# Patient Record
Sex: Female | Born: 1970
Health system: Southern US, Community
[De-identification: ages and names within clinical notes are randomized; demographics above are authoritative.]

## PROBLEM LIST (undated history)

## (undated) DIAGNOSIS — F419 Anxiety disorder, unspecified: Secondary | ICD-10-CM

## (undated) DIAGNOSIS — I10 Essential (primary) hypertension: Secondary | ICD-10-CM

## (undated) HISTORY — PX: WISDOM TOOTH EXTRACTION: SHX21

---

## 2004-10-27 ENCOUNTER — Ambulatory Visit (HOSPITAL_COMMUNITY): Admission: RE | Admit: 2004-10-27 | Discharge: 2004-10-27 | Payer: Self-pay | Admitting: Obstetrics & Gynecology

## 2005-02-15 ENCOUNTER — Other Ambulatory Visit: Admission: RE | Admit: 2005-02-15 | Discharge: 2005-02-15 | Payer: Self-pay | Admitting: Obstetrics and Gynecology

## 2005-07-21 ENCOUNTER — Inpatient Hospital Stay (HOSPITAL_COMMUNITY): Admission: AD | Admit: 2005-07-21 | Discharge: 2005-07-21 | Payer: Self-pay | Admitting: Obstetrics and Gynecology

## 2005-08-13 ENCOUNTER — Inpatient Hospital Stay (HOSPITAL_COMMUNITY): Admission: AD | Admit: 2005-08-13 | Discharge: 2005-08-15 | Payer: Self-pay | Admitting: Obstetrics & Gynecology

## 2005-08-22 ENCOUNTER — Ambulatory Visit: Admission: RE | Admit: 2005-08-22 | Discharge: 2005-08-22 | Payer: Self-pay | Admitting: Obstetrics and Gynecology

## 2005-09-26 ENCOUNTER — Other Ambulatory Visit: Admission: RE | Admit: 2005-09-26 | Discharge: 2005-09-26 | Payer: Self-pay | Admitting: Obstetrics and Gynecology

## 2005-10-31 ENCOUNTER — Encounter: Admission: RE | Admit: 2005-10-31 | Discharge: 2005-10-31 | Payer: Self-pay | Admitting: Obstetrics and Gynecology

## 2006-08-07 ENCOUNTER — Inpatient Hospital Stay (HOSPITAL_COMMUNITY): Admission: AD | Admit: 2006-08-07 | Discharge: 2006-08-07 | Payer: Self-pay | Admitting: Obstetrics and Gynecology

## 2006-10-02 ENCOUNTER — Inpatient Hospital Stay (HOSPITAL_COMMUNITY): Admission: AD | Admit: 2006-10-02 | Discharge: 2006-10-03 | Payer: Self-pay | Admitting: Obstetrics and Gynecology

## 2012-02-27 LAB — OB RESULTS CONSOLE GC/CHLAMYDIA: Chlamydia: NEGATIVE

## 2012-03-12 LAB — OB RESULTS CONSOLE RPR: RPR: NONREACTIVE

## 2012-03-12 LAB — OB RESULTS CONSOLE ANTIBODY SCREEN: Antibody Screen: NEGATIVE

## 2012-03-12 LAB — OB RESULTS CONSOLE GC/CHLAMYDIA: Gonorrhea: NEGATIVE

## 2012-03-12 LAB — OB RESULTS CONSOLE GBS: GBS: NEGATIVE

## 2012-09-13 ENCOUNTER — Inpatient Hospital Stay (HOSPITAL_COMMUNITY)
Admission: AD | Admit: 2012-09-13 | Discharge: 2012-09-15 | DRG: 775 | Disposition: A | Discharge status: Home / Self Care | Payer: No Typology Code available for payment source | Source: Ambulatory Visit | Attending: Obstetrics and Gynecology | Admitting: Obstetrics and Gynecology

## 2012-09-13 ENCOUNTER — Encounter (HOSPITAL_COMMUNITY): Payer: Self-pay | Admitting: Anesthesiology

## 2012-09-13 ENCOUNTER — Observation Stay (HOSPITAL_COMMUNITY): Payer: No Typology Code available for payment source | Admitting: Anesthesiology

## 2012-09-13 ENCOUNTER — Encounter (HOSPITAL_COMMUNITY): Payer: Self-pay

## 2012-09-13 DIAGNOSIS — O09529 Supervision of elderly multigravida, unspecified trimester: Secondary | ICD-10-CM | POA: Diagnosis present

## 2012-09-13 HISTORY — DX: Anxiety disorder, unspecified: F41.9

## 2012-09-13 LAB — COMPREHENSIVE METABOLIC PANEL
ALT: 8 U/L (ref 0–35)
AST: 15 U/L (ref 0–37)
Albumin: 2.8 g/dL — ABNORMAL LOW (ref 3.5–5.2)
Calcium: 9.5 mg/dL (ref 8.4–10.5)
Creatinine, Ser: 0.63 mg/dL (ref 0.50–1.10)
Sodium: 136 mEq/L (ref 135–145)
Total Protein: 6.9 g/dL (ref 6.0–8.3)

## 2012-09-13 LAB — TYPE AND SCREEN
ABO/RH(D): B POS
Antibody Screen: NEGATIVE

## 2012-09-13 LAB — CBC
MCH: 31.7 pg (ref 26.0–34.0)
MCV: 90.5 fL (ref 78.0–100.0)
Platelets: 240 10*3/uL (ref 150–400)
RDW: 13.2 % (ref 11.5–15.5)

## 2012-09-13 LAB — ABO/RH: ABO/RH(D): B POS

## 2012-09-13 MED ORDER — TETANUS-DIPHTH-ACELL PERTUSSIS 5-2.5-18.5 LF-MCG/0.5 IM SUSP: 0.5000 mL | Freq: Once | INTRAMUSCULAR | Status: DC

## 2012-09-13 MED ORDER — DIPHENHYDRAMINE HCL 25 MG PO CAPS: 25.0000 mg | ORAL_CAPSULE | Freq: Four times a day (QID) | ORAL | Status: DC | PRN

## 2012-09-13 MED ORDER — OXYCODONE-ACETAMINOPHEN 5-325 MG PO TABS
1.0000 | ORAL_TABLET | ORAL | Status: DC | PRN
  Administered 2012-09-14 – 2012-09-15 (×2): 1 via ORAL
  Filled 2012-09-13 (×2): qty 1

## 2012-09-13 MED ORDER — PRENATAL MULTIVITAMIN CH
1.0000 | ORAL_TABLET | Freq: Every day | ORAL | Status: DC
  Administered 2012-09-14 – 2012-09-15 (×2): 1 via ORAL
  Filled 2012-09-13 (×2): qty 1

## 2012-09-13 MED ORDER — ZOLPIDEM TARTRATE 5 MG PO TABS: 5.0000 mg | ORAL_TABLET | Freq: Every evening | ORAL | Status: DC | PRN

## 2012-09-13 MED ORDER — ONDANSETRON HCL 4 MG/2ML IJ SOLN: 4.0000 mg | INTRAMUSCULAR | Status: DC | PRN

## 2012-09-13 MED ORDER — PHENYLEPHRINE 40 MCG/ML (10ML) SYRINGE FOR IV PUSH (FOR BLOOD PRESSURE SUPPORT)
80.0000 ug | PREFILLED_SYRINGE | INTRAVENOUS | Status: DC | PRN
  Filled 2012-09-13: qty 5

## 2012-09-13 MED ORDER — ONDANSETRON HCL 4 MG PO TABS: 4.0000 mg | ORAL_TABLET | ORAL | Status: DC | PRN

## 2012-09-13 MED ORDER — LACTATED RINGERS IV SOLN: 500.0000 mL | Freq: Once | INTRAVENOUS | Status: DC

## 2012-09-13 MED ORDER — LACTATED RINGERS IV SOLN
500.0000 mL | INTRAVENOUS | Status: DC | PRN
  Administered 2012-09-13: 500 mL via INTRAVENOUS
  Administered 2012-09-13: 1000 mL via INTRAVENOUS

## 2012-09-13 MED ORDER — ACETAMINOPHEN 325 MG PO TABS: 650.0000 mg | ORAL_TABLET | ORAL | Status: DC | PRN

## 2012-09-13 MED ORDER — SIMETHICONE 80 MG PO CHEW: 80.0000 mg | CHEWABLE_TABLET | ORAL | Status: DC | PRN

## 2012-09-13 MED ORDER — BENZOCAINE-MENTHOL 20-0.5 % EX AERO
1.0000 "application " | INHALATION_SPRAY | CUTANEOUS | Status: DC | PRN
  Administered 2012-09-14: 1 via TOPICAL
  Filled 2012-09-13 (×2): qty 56

## 2012-09-13 MED ORDER — IBUPROFEN 600 MG PO TABS: 600.0000 mg | ORAL_TABLET | Freq: Four times a day (QID) | ORAL | Status: DC | PRN

## 2012-09-13 MED ORDER — WITCH HAZEL-GLYCERIN EX PADS: 1.0000 "application " | MEDICATED_PAD | CUTANEOUS | Status: DC | PRN

## 2012-09-13 MED ORDER — FENTANYL 2.5 MCG/ML BUPIVACAINE 1/10 % EPIDURAL INFUSION (WH - ANES)
14.0000 mL/h | INTRAMUSCULAR | Status: DC
  Administered 2012-09-13: 14 mL/h via EPIDURAL
  Filled 2012-09-13: qty 125

## 2012-09-13 MED ORDER — DIBUCAINE 1 % RE OINT: 1.0000 "application " | TOPICAL_OINTMENT | RECTAL | Status: DC | PRN

## 2012-09-13 MED ORDER — BISACODYL 10 MG RE SUPP: 10.0000 mg | Freq: Every day | RECTAL | Status: DC | PRN

## 2012-09-13 MED ORDER — LIDOCAINE HCL (PF) 1 % IJ SOLN
30.0000 mL | INTRAMUSCULAR | Status: DC | PRN
  Filled 2012-09-13: qty 30

## 2012-09-13 MED ORDER — LIDOCAINE HCL (PF) 1 % IJ SOLN
INTRAMUSCULAR | Status: DC | PRN
  Administered 2012-09-13 (×2): 5 mL

## 2012-09-13 MED ORDER — OXYTOCIN 40 UNITS IN LACTATED RINGERS INFUSION - SIMPLE MED
62.5000 mL/h | INTRAVENOUS | Status: DC
  Administered 2012-09-13: 62.5 mL/h via INTRAVENOUS
  Filled 2012-09-13: qty 1000

## 2012-09-13 MED ORDER — FLEET ENEMA 7-19 GM/118ML RE ENEM: 1.0000 | ENEMA | RECTAL | Status: DC | PRN

## 2012-09-13 MED ORDER — IBUPROFEN 600 MG PO TABS
600.0000 mg | ORAL_TABLET | Freq: Four times a day (QID) | ORAL | Status: DC
  Administered 2012-09-14 – 2012-09-15 (×6): 600 mg via ORAL
  Filled 2012-09-13 (×6): qty 1

## 2012-09-13 MED ORDER — EPHEDRINE 5 MG/ML INJ: 10.0000 mg | INTRAVENOUS | Status: DC | PRN

## 2012-09-13 MED ORDER — CITRIC ACID-SODIUM CITRATE 334-500 MG/5ML PO SOLN: 30.0000 mL | ORAL | Status: DC | PRN

## 2012-09-13 MED ORDER — DIPHENHYDRAMINE HCL 50 MG/ML IJ SOLN: 12.5000 mg | INTRAMUSCULAR | Status: DC | PRN

## 2012-09-13 MED ORDER — OXYCODONE-ACETAMINOPHEN 5-325 MG PO TABS: 1.0000 | ORAL_TABLET | ORAL | Status: DC | PRN

## 2012-09-13 MED ORDER — LANOLIN HYDROUS EX OINT: TOPICAL_OINTMENT | CUTANEOUS | Status: DC | PRN

## 2012-09-13 MED ORDER — EPHEDRINE 5 MG/ML INJ
10.0000 mg | INTRAVENOUS | Status: DC | PRN
  Filled 2012-09-13: qty 4

## 2012-09-13 MED ORDER — ONDANSETRON HCL 4 MG/2ML IJ SOLN: 4.0000 mg | Freq: Four times a day (QID) | INTRAMUSCULAR | Status: DC | PRN

## 2012-09-13 MED ORDER — SENNOSIDES-DOCUSATE SODIUM 8.6-50 MG PO TABS
2.0000 | ORAL_TABLET | Freq: Every day | ORAL | Status: DC
  Administered 2012-09-14: 2 via ORAL

## 2012-09-13 MED ORDER — PHENYLEPHRINE 40 MCG/ML (10ML) SYRINGE FOR IV PUSH (FOR BLOOD PRESSURE SUPPORT): 80.0000 ug | PREFILLED_SYRINGE | INTRAVENOUS | Status: DC | PRN

## 2012-09-13 MED ORDER — OXYTOCIN BOLUS FROM INFUSION: 500.0000 mL | INTRAVENOUS | Status: DC

## 2012-09-13 MED ORDER — FLEET ENEMA 7-19 GM/118ML RE ENEM: 1.0000 | ENEMA | Freq: Every day | RECTAL | Status: DC | PRN

## 2012-09-13 MED ORDER — LACTATED RINGERS IV SOLN
INTRAVENOUS | Status: DC
  Administered 2012-09-13: 1000 mL via INTRAVENOUS

## 2012-09-13 NOTE — Progress Notes (Signed)
Delivery Note At 8:29 PM a viable female was delivered via Vaginal, Spontaneous Delivery (Presentation: ROA  ).  APGAR:8 ,9 ; weight pending .   Placenta status:intact , . 3 vessel  Cord:  with the following complications:none .  Cord pH: pending  Anesthesia: Epidural  Episiotomy:  Lacerations: small second degree ML lac repaired Suture Repair: vicryl rapide Est. Blood Loss (mL):   Mom to postpartum.  Baby to nursery-stable.  Xaniyah Buchholz II,Izsak Meir E 09/13/2012, 8:41 PM

## 2012-09-13 NOTE — MAU Note (Signed)
Pt states was 3cm/50% effaced Tuesday in office. Ctx's q6.5 minutes apart. Denies bleeding. Lost mucus plug

## 2012-09-13 NOTE — H&P (Signed)
Sherry Hardy is a 41 y.o. female presenting for C/O contractions. No ROM/bleeding.  + passage of mucous plug.  No HA/vision change, no epigastric pain Maternal Medical History:  Contractions: Onset was 3-5 hours ago.    Fetal activity: Perceived fetal activity is normal.      OB History    Grav Para Term Preterm Abortions TAB SAB Ect Mult Living   5 2 2  2 1 1   2      Past Medical History  Diagnosis Date  . Anxiety    Past Surgical History  Procedure Date  . Wisdom tooth extraction    Family History: family history is negative for Other. Social History:  reports that she has never smoked. She has never used smokeless tobacco. She reports that she does not drink alcohol or use illicit drugs.   Prenatal Transfer Tool  Maternal Diabetes: No Genetic Screening: Declined Maternal Ultrasounds/Referrals: Normal Fetal Ultrasounds or other Referrals:  None Maternal Substance Abuse:  No Significant Maternal Medications:  None Significant Maternal Lab Results:  None Other Comments:  echogenic focus in left fetal ventricle on early U/S, it resolved on subsequent U/S  Review of Systems  Eyes: Negative for blurred vision.  Gastrointestinal: Negative for abdominal pain.  Neurological: Negative for headaches.    Dilation: 4 Effacement (%): 70 Station: -2 Exam by:: dr Apolonia Ellwood Blood pressure 142/89, pulse 73, temperature 98.3 F (36.8 C), temperature source Oral, resp. rate 18, height 4\' 11"  (1.499 m), weight 53.071 kg (117 lb), SpO2 100.00%. Maternal Exam:  Uterine Assessment: Contraction strength is firm.  Contraction frequency is regular.   Abdomen: Fetal presentation: vertex     Fetal Exam Fetal Monitor Review: Pattern: accelerations present.       Physical Exam  Cardiovascular: Normal rate and regular rhythm.   Respiratory: Effort normal and breath sounds normal.  GI: There is no tenderness.  Neurological: She has normal reflexes.   AROM  Clear Prenatal  labs: ABO, Rh: --/--/B POS (11/01 1230) Antibody: NEG (11/01 1230) Rubella: Immune (04/30 0000) RPR: Nonreactive (04/30 0000)  HBsAg: Negative (04/30 0000)  HIV: Non-reactive (04/30 0000)  GBS: Negative (04/30 0000)  Results for orders placed during the hospital encounter of 09/13/12 (from the past 24 hour(s))  CBC     Status: Normal   Collection Time   09/13/12 12:30 PM      Component Value Range   WBC 10.3  4.0 - 10.5 K/uL   RBC 4.20  3.87 - 5.11 MIL/uL   Hemoglobin 13.3  12.0 - 15.0 g/dL   HCT 16.1  09.6 - 04.5 %   MCV 90.5  78.0 - 100.0 fL   MCH 31.7  26.0 - 34.0 pg   MCHC 35.0  30.0 - 36.0 g/dL   RDW 40.9  81.1 - 91.4 %   Platelets 240  150 - 400 K/uL  COMPREHENSIVE METABOLIC PANEL     Status: Abnormal   Collection Time   09/13/12 12:30 PM      Component Value Range   Sodium 136  135 - 145 mEq/L   Potassium 4.2  3.5 - 5.1 mEq/L   Chloride 101  96 - 112 mEq/L   CO2 23  19 - 32 mEq/L   Glucose, Bld 76  70 - 99 mg/dL   BUN 7  6 - 23 mg/dL   Creatinine, Ser 7.82  0.50 - 1.10 mg/dL   Calcium 9.5  8.4 - 95.6 mg/dL   Total Protein 6.9  6.0 - 8.3 g/dL   Albumin 2.8 (*) 3.5 - 5.2 g/dL   AST 15  0 - 37 U/L   ALT 8  0 - 35 U/L   Alkaline Phosphatase 114  39 - 117 U/L   Total Bilirubin 0.3  0.3 - 1.2 mg/dL   GFR calc non Af Amer >90  >90 mL/min   GFR calc Af Amer >90  >90 mL/min  TYPE AND SCREEN     Status: Normal   Collection Time   09/13/12 12:30 PM      Component Value Range   ABO/RH(D) B POS     Antibody Screen NEG     Sample Expiration 09/16/2012     Assessment/Plan: 41 yo G5P2 at 37 1/7 weeks with spontaneous onset of labor BP elevated, but labs normal and no symptoms of preeclampsia.   Taneia Mealor II,Landon Bassford E 09/13/2012, 3:52 PM

## 2012-09-13 NOTE — Anesthesia Preprocedure Evaluation (Signed)
Anesthesia Evaluation  Patient identified by MRN, date of birth, ID band Patient awake    Reviewed: Allergy & Precautions, H&P , Patient's Chart, lab work & pertinent test results  Airway Mallampati: II TM Distance: >3 FB Neck ROM: full    Dental No notable dental hx.    Pulmonary neg pulmonary ROS,  breath sounds clear to auscultation  Pulmonary exam normal       Cardiovascular negative cardio ROS  Rhythm:regular Rate:Normal     Neuro/Psych PSYCHIATRIC DISORDERS Anxiety negative neurological ROS  negative psych ROS   GI/Hepatic negative GI ROS, Neg liver ROS,   Endo/Other  negative endocrine ROS  Renal/GU negative Renal ROS     Musculoskeletal   Abdominal   Peds  Hematology negative hematology ROS (+)   Anesthesia Other Findings   Reproductive/Obstetrics (+) Pregnancy                           Anesthesia Physical Anesthesia Plan  ASA: II  Anesthesia Plan: Epidural   Post-op Pain Management:    Induction:   Airway Management Planned:   Additional Equipment:   Intra-op Plan:   Post-operative Plan:   Informed Consent: I have reviewed the patients History and Physical, chart, labs and discussed the procedure including the risks, benefits and alternatives for the proposed anesthesia with the patient or authorized representative who has indicated his/her understanding and acceptance.     Plan Discussed with:   Anesthesia Plan Comments:         Anesthesia Quick Evaluation

## 2012-09-13 NOTE — Anesthesia Procedure Notes (Signed)
Epidural Patient location during procedure: OB Start time: 09/13/2012 3:10 PM  Staffing Anesthesiologist: Brayton Caves R Performed by: anesthesiologist   Preanesthetic Checklist Completed: patient identified, site marked, surgical consent, pre-op evaluation, timeout performed, IV checked, risks and benefits discussed and monitors and equipment checked  Epidural Patient position: sitting Prep: site prepped and draped and DuraPrep Patient monitoring: continuous pulse ox and blood pressure Approach: midline Injection technique: LOR air and LOR saline  Needle:  Needle type: Tuohy  Needle gauge: 17 G Needle length: 9 cm and 9 Needle insertion depth: 4 cm Catheter type: closed end flexible Catheter size: 19 Gauge Catheter at skin depth: 10 cm Test dose: negative  Assessment Events: blood not aspirated, injection not painful, no injection resistance, negative IV test and no paresthesia  Additional Notes Patient identified.  Risk benefits discussed including failed block, incomplete pain control, headache, nerve damage, paralysis, blood pressure changes, nausea, vomiting, reactions to medication both toxic or allergic, and postpartum back pain.  Patient expressed understanding and wished to proceed.  All questions were answered.  Sterile technique used throughout procedure and epidural site dressed with sterile barrier dressing. No paresthesia or other complications noted.The patient did not experience any signs of intravascular injection such as tinnitus or metallic taste in mouth nor signs of intrathecal spread such as rapid motor block. Please see nursing notes for vital signs.

## 2012-09-13 NOTE — MAU Note (Signed)
Was 3 when last checked in office.  Lost mucous plug, ? Leaking after.

## 2012-09-14 ENCOUNTER — Encounter (HOSPITAL_COMMUNITY): Payer: Self-pay | Admitting: *Deleted

## 2012-09-14 LAB — RPR: RPR Ser Ql: NONREACTIVE

## 2012-09-14 LAB — CBC
Hemoglobin: 11.9 g/dL — ABNORMAL LOW (ref 12.0–15.0)
MCH: 31.5 pg (ref 26.0–34.0)
Platelets: 192 10*3/uL (ref 150–400)
RBC: 3.78 MIL/uL — ABNORMAL LOW (ref 3.87–5.11)
WBC: 13.9 10*3/uL — ABNORMAL HIGH (ref 4.0–10.5)

## 2012-09-14 NOTE — Anesthesia Postprocedure Evaluation (Signed)
  Anesthesia Post-op Note  Patient: Sherry Hardy  Procedure(s) Performed: * No procedures listed *  Patient Location: Mother/Baby  Anesthesia Type:Epidural  Level of Consciousness: awake, alert  and oriented  Airway and Oxygen Therapy: Patient Spontanous Breathing  Post-op Pain: mild  Post-op Assessment: Patient's Cardiovascular Status Stable, Respiratory Function Stable, Patent Airway, No signs of Nausea or vomiting and Pain level controlled  Post-op Vital Signs: stable  Complications: No apparent anesthesia complications

## 2012-09-14 NOTE — Progress Notes (Signed)
Post Partum Day 1 Subjective: no complaints  Objective: Blood pressure 128/77, pulse 91, temperature 98.6 F (37 C), temperature source Oral, resp. rate 18, height 4\' 11"  (1.499 m), weight 53.071 kg (117 lb), SpO2 97.00%.  Physical Exam:  General: alert and no distress Lochia: appropriate Uterine Fundus: firm Incision: healing well DVT Evaluation: No evidence of DVT seen on physical exam.   Basename 09/14/12 0620 09/13/12 1230  HGB 11.9* 13.3  HCT 34.2* 38.0    Assessment/Plan: Plan for discharge tomorrow   LOS: 1 day   Gerome Kokesh II,Wrenn Willcox E 09/14/2012, 9:37 AM

## 2012-09-15 MED ORDER — IBUPROFEN 600 MG PO TABS: 600.0000 mg | ORAL_TABLET | Freq: Four times a day (QID) | ORAL | Status: AC | PRN

## 2012-09-15 MED ORDER — OXYCODONE-ACETAMINOPHEN 5-325 MG PO TABS: 1.0000 | ORAL_TABLET | Freq: Four times a day (QID) | ORAL | Status: DC | PRN

## 2012-09-15 MED ORDER — BENZOCAINE-MENTHOL 20-0.5 % EX AERO: 1.0000 "application " | INHALATION_SPRAY | CUTANEOUS | Status: DC | PRN

## 2012-09-15 NOTE — Progress Notes (Signed)
Post Partum Day 2 Subjective: no complaints  Objective: Blood pressure 132/90, pulse 81, temperature 97.7 F (36.5 C), temperature source Oral, resp. rate 18, height 4\' 11"  (1.499 m), weight 53.071 kg (117 lb), SpO2 96.00%, unknown if currently breastfeeding.  Physical Exam:  General: alert and no distress Lochia: appropriate Uterine Fundus: firm Incision: healing well DVT Evaluation: No evidence of DVT seen on physical exam.   Basename 09/14/12 0620 09/13/12 1230  HGB 11.9* 13.3  HCT 34.2* 38.0    Assessment/Plan: Discharge home   LOS: 2 days   Keifer Habib II,Maricel Swartzendruber E 09/15/2012, 9:10 AM

## 2012-09-15 NOTE — Discharge Summary (Signed)
Obstetric Discharge Summary Reason for Admission: onset of labor Prenatal Procedures: ultrasound Intrapartum Procedures: spontaneous vaginal delivery Postpartum Procedures: none Complications-Operative and Postpartum: none Hemoglobin  Date Value Range Status  09/14/2012 11.9* 12.0 - 15.0 g/dL Final     HCT  Date Value Range Status  09/14/2012 34.2* 36.0 - 46.0 % Final    Physical Exam:  General: alert and no distress Lochia: appropriate Uterine Fundus: firm Incision: healing well DVT Evaluation: No evidence of DVT seen on physical exam.  Discharge Diagnoses: Term Pregnancy-delivered  Discharge Information: Date: 09/15/2012 Activity: pelvic rest Diet: routine Medications: PNV, Ibuprofen and Percocet Condition: improved Instructions: refer to practice specific booklet Discharge to: home   Newborn Data: Live born female  Birth Weight: 6 lb 1 oz (2750 g) APGAR: 9, 10  Home with mother.  Amarachukwu Lakatos II,Corbin Falck E 09/15/2012, 9:13 AM

## 2012-09-24 NOTE — Progress Notes (Signed)
Post discharge UR review completed. 

## 2014-05-01 ENCOUNTER — Other Ambulatory Visit: Payer: Self-pay | Admitting: Obstetrics and Gynecology

## 2014-05-01 DIAGNOSIS — R928 Other abnormal and inconclusive findings on diagnostic imaging of breast: Secondary | ICD-10-CM

## 2014-05-08 ENCOUNTER — Ambulatory Visit
Admission: RE | Admit: 2014-05-08 | Discharge: 2014-05-08 | Disposition: A | Discharge status: Home / Self Care | Payer: No Typology Code available for payment source | Source: Ambulatory Visit | Attending: Obstetrics and Gynecology | Admitting: Obstetrics and Gynecology

## 2014-05-08 DIAGNOSIS — R928 Other abnormal and inconclusive findings on diagnostic imaging of breast: Secondary | ICD-10-CM

## 2014-09-14 ENCOUNTER — Encounter (HOSPITAL_COMMUNITY): Payer: Self-pay | Admitting: *Deleted

## 2014-11-09 ENCOUNTER — Other Ambulatory Visit: Payer: Self-pay | Admitting: Obstetrics and Gynecology

## 2014-11-09 DIAGNOSIS — D242 Benign neoplasm of left breast: Secondary | ICD-10-CM

## 2014-11-18 ENCOUNTER — Other Ambulatory Visit: Payer: No Typology Code available for payment source

## 2014-12-07 ENCOUNTER — Other Ambulatory Visit: Payer: No Typology Code available for payment source

## 2014-12-16 ENCOUNTER — Ambulatory Visit
Admission: RE | Admit: 2014-12-16 | Discharge: 2014-12-16 | Disposition: A | Discharge status: Home / Self Care | Payer: 59 | Source: Ambulatory Visit | Attending: Obstetrics and Gynecology | Admitting: Obstetrics and Gynecology

## 2014-12-16 DIAGNOSIS — D242 Benign neoplasm of left breast: Secondary | ICD-10-CM

## 2015-03-17 ENCOUNTER — Other Ambulatory Visit: Payer: Self-pay | Admitting: Obstetrics and Gynecology

## 2015-03-17 DIAGNOSIS — N6320 Unspecified lump in the left breast, unspecified quadrant: Secondary | ICD-10-CM

## 2015-03-18 LAB — CYTOLOGY - PAP

## 2015-05-03 ENCOUNTER — Ambulatory Visit
Admission: RE | Admit: 2015-05-03 | Discharge: 2015-05-03 | Disposition: A | Discharge status: Home / Self Care | Payer: 59 | Source: Ambulatory Visit | Attending: Obstetrics and Gynecology | Admitting: Obstetrics and Gynecology

## 2015-05-03 DIAGNOSIS — N6320 Unspecified lump in the left breast, unspecified quadrant: Secondary | ICD-10-CM

## 2016-08-08 ENCOUNTER — Emergency Department (HOSPITAL_COMMUNITY): Payer: Commercial Managed Care - HMO

## 2016-08-08 ENCOUNTER — Other Ambulatory Visit: Payer: Self-pay

## 2016-08-08 ENCOUNTER — Encounter (HOSPITAL_COMMUNITY): Payer: Self-pay

## 2016-08-08 ENCOUNTER — Emergency Department (HOSPITAL_COMMUNITY)
Admission: EM | Admit: 2016-08-08 | Discharge: 2016-08-08 | Disposition: A | Discharge status: Home / Self Care | Payer: Commercial Managed Care - HMO | Attending: Emergency Medicine | Admitting: Emergency Medicine

## 2016-08-08 DIAGNOSIS — J069 Acute upper respiratory infection, unspecified: Secondary | ICD-10-CM | POA: Insufficient documentation

## 2016-08-08 DIAGNOSIS — R42 Dizziness and giddiness: Secondary | ICD-10-CM | POA: Insufficient documentation

## 2016-08-08 DIAGNOSIS — R0602 Shortness of breath: Secondary | ICD-10-CM | POA: Diagnosis present

## 2016-08-08 LAB — CBC
HEMATOCRIT: 47.3 % — AB (ref 36.0–46.0)
Hemoglobin: 16.3 g/dL — ABNORMAL HIGH (ref 12.0–15.0)
MCH: 32 pg (ref 26.0–34.0)
MCHC: 34.5 g/dL (ref 30.0–36.0)
MCV: 92.7 fL (ref 78.0–100.0)
Platelets: 269 10*3/uL (ref 150–400)
RBC: 5.1 MIL/uL (ref 3.87–5.11)
RDW: 12.2 % (ref 11.5–15.5)
WBC: 12.7 10*3/uL — AB (ref 4.0–10.5)

## 2016-08-08 LAB — BASIC METABOLIC PANEL
Anion gap: 9 (ref 5–15)
BUN: 9 mg/dL (ref 6–20)
CHLORIDE: 103 mmol/L (ref 101–111)
CO2: 26 mmol/L (ref 22–32)
Calcium: 9.5 mg/dL (ref 8.9–10.3)
Creatinine, Ser: 0.7 mg/dL (ref 0.44–1.00)
GFR calc Af Amer: >60 mL/min (ref >60)
GFR calc non Af Amer: >60 mL/min (ref >60)
Glucose, Bld: 122 mg/dL — ABNORMAL HIGH (ref 65–99)
POTASSIUM: 4.4 mmol/L (ref 3.5–5.1)
SODIUM: 138 mmol/L (ref 135–145)

## 2016-08-08 LAB — URINE MICROSCOPIC-ADD ON

## 2016-08-08 LAB — URINALYSIS, ROUTINE W REFLEX MICROSCOPIC
Bilirubin Urine: NEGATIVE
GLUCOSE, UA: NEGATIVE mg/dL
Hgb urine dipstick: NEGATIVE
Ketones, ur: 15 mg/dL — AB
Nitrite: NEGATIVE
PH: 7.5 (ref 5.0–8.0)
Protein, ur: NEGATIVE mg/dL
Specific Gravity, Urine: 1.009 (ref 1.005–1.030)

## 2016-08-08 LAB — I-STAT TROPONIN, ED: Troponin i, poc: 0 ng/mL (ref 0.00–0.08)

## 2016-08-08 MED ORDER — MECLIZINE HCL 25 MG PO TABS: 25.0000 mg | ORAL_TABLET | Freq: Three times a day (TID) | ORAL | 0 refills | Status: AC | PRN

## 2016-08-08 MED ORDER — ONDANSETRON HCL 4 MG/2ML IJ SOLN
4.0000 mg | Freq: Once | INTRAMUSCULAR | Status: AC
  Administered 2016-08-08: 4 mg via INTRAVENOUS
  Filled 2016-08-08: qty 2

## 2016-08-08 MED ORDER — SODIUM CHLORIDE 0.9 % IV BOLUS (SEPSIS)
1000.0000 mL | Freq: Once | INTRAVENOUS | Status: AC
  Administered 2016-08-08: 1000 mL via INTRAVENOUS

## 2016-08-08 MED ORDER — MECLIZINE HCL 25 MG PO TABS
25.0000 mg | ORAL_TABLET | Freq: Once | ORAL | Status: AC
  Administered 2016-08-08: 25 mg via ORAL
  Filled 2016-08-08: qty 1

## 2016-08-08 MED ORDER — MECLIZINE HCL 25 MG PO TABS: 25.0000 mg | ORAL_TABLET | Freq: Three times a day (TID) | ORAL | 0 refills | Status: DC | PRN

## 2016-08-08 NOTE — ED Triage Notes (Signed)
Pt reports she has had dizziness today. Pt reports sinus congestion as well. She reports shortness of breath that was sudden onset as well.

## 2016-08-08 NOTE — ED Provider Notes (Signed)
I saw and evaluated the patient, reviewed the resident's note and I agree with the findings and plan.   EKG Interpretation  Date/Time:  Tuesday August 08 2016 10:39:29 EDT Ventricular Rate:  78 PR Interval:  162 QRS Duration: 80 QT Interval:  402 QTC Calculation: 458 R Axis:   58 Text Interpretation:  Normal sinus rhythm Normal ECG Interpretation limited secondary to artifact Confirmed by Latrease Kunde  MD, Jovanni Rash [phone]) on 08/08/2016 2:12:46 PM      Results for orders placed or performed during the hospital encounter of [id number]  Basic metabolic panel  Result Value Ref Range   Sodium 138 135 - 145 mmol/L   Potassium 4.4 3.5 - 5.1 mmol/L   Chloride 103 101 - 111 mmol/L   CO2 26 22 - 32 mmol/L   Glucose, Bld 122 (H) 65 - 99 mg/dL   BUN 9 6 - 20 mg/dL   Creatinine, Ser 0.70 0.44 - 1.00 mg/dL   Calcium 9.5 8.9 - 10.3 mg/dL   GFR calc non Af Amer >60 >60 mL/min   GFR calc Af Amer >60 >60 mL/min   Anion gap 9 5 - 15  CBC  Result Value Ref Range   WBC 12.7 (H) 4.0 - 10.5 K/uL   RBC 5.10 3.87 - 5.11 MIL/uL   Hemoglobin 16.3 (H) 12.0 - 15.0 g/dL   HCT 47.3 (H) 36.0 - 46.0 %   MCV 92.7 78.0 - 100.0 fL   MCH 32.0 26.0 - 34.0 pg   MCHC 34.5 30.0 - 36.0 g/dL   RDW 12.2 11.5 - 15.5 %   Platelets 269 150 - 400 K/uL  I-stat troponin, ED  Result Value Ref Range   Troponin i, poc 0.00 0.00 - 0.08 ng/mL   Comment 3            Patient seen by me along with the resident. Patient with acute onset of vertigo this morning. With room spinning associated with feeling of nausea. Patient's had upper respiratory infection symptoms for the past few days including her husband has had similar symptoms. Symptoms seem to be consistent with the viral based vertigo. No significant neuro focal deficits. Patient will be treated with meclizine. Patient will be discharged with a prescription for meclizine and referral to neurology for local follow-up.      Fredia Sorrow, MD 08/08/16 1555

## 2016-08-08 NOTE — ED Provider Notes (Signed)
[facility] DEPT Provider Note   CSN: [id number] Arrival date & time: 08/08/16  1036     History   Chief Complaint Chief Complaint  Patient presents with  . Dizziness  . Shortness of Breath    HPI Sherry Hardy is a 45 y.o. female.   Dizziness  Quality:  Room spinning Severity:  Moderate Onset quality:  Sudden Duration:  1 day Timing:  Constant Progression:  Unchanged Chronicity:  New Context: bending over and head movement   Relieved by:  Being still Associated symptoms: nausea, shortness of breath and vomiting   Associated symptoms: no blood in stool, no chest pain, no diarrhea, no headaches, no hearing loss, no palpitations and no syncope     Past Medical History:  Diagnosis Date  . Anxiety     There are no active problems to display for this patient.   Past Surgical History:  Procedure Laterality Date  . WISDOM TOOTH EXTRACTION      OB History    Gravida Para Term Preterm AB Living   5 3 3   2 3    SAB TAB Ectopic Multiple Live Births   1 1     1        Home Medications    Prior to Admission medications   Medication Sig Start Date End Date Taking? Authorizing Provider  benzocaine-Menthol (DERMOPLAST) 20-0.5 % AERO Apply 1 application topically as needed (perineal discomfort). 09/15/12   Everlene Farrier, MD  ibuprofen (ADVIL,MOTRIN) 600 MG tablet Take 1 tablet (600 mg total) by mouth every 6 (six) hours as needed for pain. 09/15/12   Everlene Farrier, MD  oxyCODONE-acetaminophen (PERCOCET/ROXICET) 5-325 MG per tablet Take 1-2 tablets by mouth every 6 (six) hours as needed (moderate - severe pain). 09/15/12   Everlene Farrier, MD  Prenatal Vit-Fe Fumarate-FA (PRENATAL MULTIVITAMIN) TABS Take 1 tablet by mouth daily.    Historical Provider, MD    Family History Family History  Problem Relation Age of Onset  . Other Neg Hx     Social History Social History  Substance Use Topics  . Smoking status: Never Smoker  . Smokeless tobacco: Never Used  .  Alcohol use No     Allergies   Review of patient's allergies indicates no known allergies.   Review of Systems Review of Systems  Constitutional: Negative for chills and fever.  HENT: Positive for congestion, ear pain and rhinorrhea. Negative for hearing loss and sore throat.   Eyes: Negative for pain and visual disturbance.  Respiratory: Positive for shortness of breath. Negative for cough.   Cardiovascular: Negative for chest pain, palpitations and syncope.  Gastrointestinal: Positive for nausea and vomiting. Negative for abdominal pain, blood in stool and diarrhea.  Genitourinary: Negative for dysuria and hematuria.  Musculoskeletal: Negative for arthralgias and back pain.  Skin: Negative for color change and rash.  Neurological: Positive for dizziness. Negative for seizures, syncope and headaches.  All other systems reviewed and are negative.    Physical Exam Updated Vital Signs BP 140/98 (BP Location: Right Arm)   Pulse 68   Temp 97.8 F (36.6 C) (Oral)   Resp 12   Ht 4\' 11"  (1.499 m)   Wt 47.6 kg   LMP 08/03/2016 (Exact Date)   SpO2 100%   BMI 21.21 kg/m   Physical Exam  Constitutional: She is oriented to person, place, and time. She appears well-developed and well-nourished. No distress.  HENT:  Head: Normocephalic and atraumatic.  Eyes: Conjunctivae are normal. Pupils are equal,  round, and reactive to light.  Neck: Normal range of motion. Neck supple.  Cardiovascular: Normal rate and regular rhythm.   Pulmonary/Chest: Effort normal and breath sounds normal. No respiratory distress.  Abdominal: Soft. There is no tenderness.  Musculoskeletal: She exhibits no edema.  Neurological: She is alert and oriented to person, place, and time. She displays normal reflexes. No cranial nerve deficit. She exhibits normal muscle tone. Coordination normal.  Skin: Skin is warm and dry.  Psychiatric: She has a normal mood and affect.  Nursing note and vitals reviewed.    ED  Treatments / Results  Labs (all labs ordered are listed, but only abnormal results are displayed) Labs Reviewed  BASIC METABOLIC PANEL - Abnormal; Notable for the following:       Result Value   Glucose, Bld 122 (*)    All other components within normal limits  CBC - Abnormal; Notable for the following:    WBC 12.7 (*)    Hemoglobin 16.3 (*)    HCT 47.3 (*)    All other components within normal limits  URINALYSIS, ROUTINE W REFLEX MICROSCOPIC (NOT AT [facility]) - Abnormal; Notable for the following:    Ketones, ur 15 (*)    Leukocytes, UA MODERATE (*)    All other components within normal limits  URINE MICROSCOPIC-ADD ON - Abnormal; Notable for the following:    Squamous Epithelial / LPF 0-5 (*)    Bacteria, UA FEW (*)    All other components within normal limits  I-STAT TROPOININ, ED    EKG  EKG Interpretation  Date/Time:  Tuesday August 08 2016 10:39:29 EDT Ventricular Rate:  78 PR Interval:  162 QRS Duration: 80 QT Interval:  402 QTC Calculation: 458 R Axis:   58 Text Interpretation:  Normal sinus rhythm Normal ECG Interpretation limited secondary to artifact Confirmed by ZACKOWSKI  MD, SCOTT [phone]) on 08/08/2016 2:12:46 PM       Radiology Dg Chest 2 View  Result Date: 08/08/2016 CLINICAL DATA:  Shortness of breath and dizziness EXAM: CHEST  2 VIEW COMPARISON:  None. FINDINGS: Lungs are clear. Heart size and pulmonary vascularity are normal. No adenopathy. There is mid thoracic levoscoliosis. No bone lesions evident. IMPRESSION: No edema or consolidation. Electronically Signed   By: Lowella Grip III M.D.   On: 08/08/2016 11:31    Procedures Procedures (including critical care time)  Medications Ordered in ED Medications  sodium chloride 0.9 % bolus 1,000 mL (0 mLs Intravenous Stopped 08/08/16 1535)  ondansetron (ZOFRAN) injection 4 mg (4 mg Intravenous Given 08/08/16 1437)  meclizine (ANTIVERT) tablet 25 mg (25 mg Oral Given 08/08/16 1538)     Initial  Impression / Assessment and Plan / ED Course  I have reviewed the triage vital signs and the nursing notes.  Pertinent labs & imaging results that were available during my care of the patient were reviewed by me and considered in my medical decision making (see chart for details).  Clinical Course    Patient presents with symptoms of acute URI with vertigo.  Dix-hallpike maneuver reproduces symptoms but does not alleviate vertigo with repetition.  Patient endorses significant vomiting and looks clinically dry. Given 1L NS with improvement in dizziness.  Given antivert.  Labs ordered, including troponin, CBC, BMP, Ua.  Results remarkable for leukocytosis.  EKG demonstrates NSR with artifact.  CXR ordered, personally reviewed by me, demonstrates no acute cardiac or pulmonary processes.  Patient improved clinically and was tolerating PO challenge.  Patient discharged to home  with strict return precautions, follow up instructions, and educational materials. Given prescription for antivert.  Final Clinical Impressions(s) / ED Diagnoses   Final diagnoses:  Dizziness  Acute URI    New Prescriptions Discharge Medication List as of 08/08/2016  4:11 PM    START taking these medications   Details  meclizine (ANTIVERT) 25 MG tablet Take 1 tablet (25 mg total) by mouth 3 (three) times daily as needed for dizziness., Starting Tue 08/08/2016, Print         Elveria Rising, MD 08/09/16 [place], MD 08/13/16 1655

## 2016-08-08 NOTE — ED Notes (Signed)
Pt ambulated to restroom with no difficulty.

## 2017-03-19 DIAGNOSIS — Z0100 Encounter for examination of eyes and vision without abnormal findings: Secondary | ICD-10-CM | POA: Diagnosis not present

## 2017-03-27 ENCOUNTER — Other Ambulatory Visit: Payer: Self-pay | Admitting: Obstetrics and Gynecology

## 2017-03-27 DIAGNOSIS — N63 Unspecified lump in unspecified breast: Secondary | ICD-10-CM

## 2017-04-03 DIAGNOSIS — Z6821 Body mass index (BMI) 21.0-21.9, adult: Secondary | ICD-10-CM | POA: Diagnosis not present

## 2017-04-03 DIAGNOSIS — Z01419 Encounter for gynecological examination (general) (routine) without abnormal findings: Secondary | ICD-10-CM | POA: Diagnosis not present

## 2017-04-04 ENCOUNTER — Ambulatory Visit
Admission: RE | Admit: 2017-04-04 | Discharge: 2017-04-04 | Disposition: A | Discharge status: Home / Self Care | Payer: Commercial Managed Care - HMO | Source: Ambulatory Visit | Attending: Obstetrics and Gynecology | Admitting: Obstetrics and Gynecology

## 2017-04-04 DIAGNOSIS — N63 Unspecified lump in unspecified breast: Secondary | ICD-10-CM

## 2017-04-04 DIAGNOSIS — R922 Inconclusive mammogram: Secondary | ICD-10-CM | POA: Diagnosis not present

## 2017-04-04 DIAGNOSIS — N6489 Other specified disorders of breast: Secondary | ICD-10-CM | POA: Diagnosis not present

## 2017-05-29 DIAGNOSIS — E559 Vitamin D deficiency, unspecified: Secondary | ICD-10-CM | POA: Diagnosis not present

## 2017-07-25 DIAGNOSIS — Z23 Encounter for immunization: Secondary | ICD-10-CM | POA: Diagnosis not present

## 2018-01-07 ENCOUNTER — Ambulatory Visit: Payer: 59 | Admitting: Family Medicine

## 2018-01-07 ENCOUNTER — Encounter: Payer: Self-pay | Admitting: Nurse Practitioner

## 2018-01-07 VITALS — BP 152/98 | HR 94 | Temp 98.9°F | Resp 20 | Wt 112.4 lb

## 2018-01-07 DIAGNOSIS — R059 Cough, unspecified: Secondary | ICD-10-CM

## 2018-01-07 DIAGNOSIS — R030 Elevated blood-pressure reading, without diagnosis of hypertension: Secondary | ICD-10-CM

## 2018-01-07 DIAGNOSIS — R05 Cough: Secondary | ICD-10-CM

## 2018-01-07 MED ORDER — BENZONATATE 100 MG PO CAPS: 100.0000 mg | ORAL_CAPSULE | Freq: Three times a day (TID) | ORAL | 0 refills | Status: DC | PRN

## 2018-01-07 MED ORDER — LORATADINE 10 MG PO TABS: 10.0000 mg | ORAL_TABLET | Freq: Every day | ORAL | 0 refills | Status: DC

## 2018-01-07 MED ORDER — MONTELUKAST SODIUM 10 MG PO TABS: 10.0000 mg | ORAL_TABLET | Freq: Every day | ORAL | 0 refills | Status: DC

## 2018-01-07 NOTE — Patient Instructions (Signed)
PLAN: Take Singulair nightly for 7-14 days for cough Take: Claritin every morning for 7-14 days to help dry up secretions Take Tessalon Pearls up to 3 times daily every 8 hours with full glass of water as needed for cough Follow Up: for emergent concerns or if cough persists beyond 10 day period withOUT improvement Information provided on DASH diet (hypertension and cough) Advised to limit OTC combination cough preparations as these can increase blood pressure.  Cough, Adult A cough helps to clear your throat and lungs. A cough may last only 2-3 weeks (acute), or it may last longer than 8 weeks (chronic). Many different things can cause a cough. A cough may be a sign of an illness or another medical condition. Follow these instructions at home:  Pay attention to any changes in your cough.  Take medicines only as told by your doctor. ? If you were prescribed an antibiotic medicine, take it as told by your doctor. Do not stop taking it even if you start to feel better. ? Talk with your doctor before you try using a cough medicine.  Drink enough fluid to keep your pee (urine) clear or pale yellow.  If the air is dry, use a cold steam vaporizer or humidifier in your home.  Stay away from things that make you cough at work or at home.  If your cough is worse at night, try using extra pillows to raise your head up higher while you sleep.  Do not smoke, and try not to be around smoke. If you need help quitting, ask your doctor.  Do not have caffeine.  Do not drink alcohol.  Rest as needed. Contact a doctor if:  You have new problems (symptoms).  You cough up yellow fluid (pus).  Your cough does not get better after 2-3 weeks, or your cough gets worse.  Medicine does not help your cough and you are not sleeping well.  You have pain that gets worse or pain that is not helped with medicine.  You have a fever.  You are losing weight and you do not know why.  You have night  sweats. Get help right away if:  You cough up blood.  You have trouble breathing.  Your heartbeat is very fast. This information is not intended to replace advice given to you by your health care provider. Make sure you discuss any questions you have with your health care provider. Document Released: 07/13/2011 Document Revised: 04/06/2016 Document Reviewed: 01/06/2015 Elsevier Interactive Patient Education  2018 [place] Eating Plan DASH stands for "Dietary Approaches to Stop Hypertension." The DASH eating plan is a healthy eating plan that has been shown to reduce high blood pressure (hypertension). It may also reduce your risk for type 2 diabetes, heart disease, and stroke. The DASH eating plan may also help with weight loss. What are tips for following this plan? General guidelines  Avoid eating more than 2,300 mg (milligrams) of salt (sodium) a day. If you have hypertension, you may need to reduce your sodium intake to 1,500 mg a day.  Limit alcohol intake to no more than 1 drink a day for nonpregnant women and 2 drinks a day for men. One drink equals 12 oz of beer, 5 oz of wine, or 1 oz of hard liquor.  Work with your health care provider to maintain a healthy body weight or to lose weight. Ask what an ideal weight is for you.  Get at least 30 minutes of exercise  that causes your heart to beat faster (aerobic exercise) most days of the week. Activities may include walking, swimming, or biking.  Work with your health care provider or diet and nutrition specialist (dietitian) to adjust your eating plan to your individual calorie needs. Reading food labels  Check food labels for the amount of sodium per serving. Choose foods with less than 5 percent of the Daily Value of sodium. Generally, foods with less than 300 mg of sodium per serving fit into this eating plan.  To find whole grains, look for the word "whole" as the first word in the ingredient list. Shopping  Buy  products labeled as "low-sodium" or "no salt added."  Buy fresh foods. Avoid canned foods and premade or frozen meals. Cooking  Avoid adding salt when cooking. Use salt-free seasonings or herbs instead of table salt or sea salt. Check with your health care provider or pharmacist before using salt substitutes.  Do not fry foods. Cook foods using healthy methods such as baking, boiling, grilling, and broiling instead.  Cook with heart-healthy oils, such as olive, canola, soybean, or sunflower oil. Meal planning   Eat a balanced diet that includes: ? 5 or more servings of fruits and vegetables each day. At each meal, try to fill half of your plate with fruits and vegetables. ? Up to 6-8 servings of whole grains each day. ? Less than 6 oz of lean meat, poultry, or fish each day. A 3-oz serving of meat is about the same size as a deck of cards. One egg equals 1 oz. ? 2 servings of low-fat dairy each day. ? A serving of nuts, seeds, or beans 5 times each week. ? Heart-healthy fats. Healthy fats called Omega-3 fatty acids are found in foods such as flaxseeds and coldwater fish, like sardines, salmon, and mackerel.  Limit how much you eat of the following: ? Canned or prepackaged foods. ? Food that is high in trans fat, such as fried foods. ? Food that is high in saturated fat, such as fatty meat. ? Sweets, desserts, sugary drinks, and other foods with added sugar. ? Full-fat dairy products.  Do not salt foods before eating.  Try to eat at least 2 vegetarian meals each week.  Eat more home-cooked food and less restaurant, buffet, and fast food.  When eating at a restaurant, ask that your food be prepared with less salt or no salt, if possible. What foods are recommended? The items listed may not be a complete list. Talk with your dietitian about what dietary choices are best for you. Grains Whole-grain or whole-wheat bread. Whole-grain or whole-wheat pasta. Brown rice. [place].  Bulgur. Whole-grain and low-sodium cereals. Pita bread. Low-fat, low-sodium crackers. Whole-wheat flour tortillas. Vegetables Fresh or frozen vegetables (raw, steamed, roasted, or grilled). Low-sodium or reduced-sodium tomato and vegetable juice. Low-sodium or reduced-sodium tomato sauce and tomato paste. Low-sodium or reduced-sodium canned vegetables. Fruits All fresh, dried, or frozen fruit. Canned fruit in natural juice (without added sugar). Meat and other protein foods Skinless chicken or [place]. Ground chicken or [place]. Pork with fat trimmed off. Fish and seafood. Egg whites. Dried beans, peas, or lentils. Unsalted nuts, nut butters, and seeds. Unsalted canned beans. Lean cuts of beef with fat trimmed off. Low-sodium, lean deli meat. Dairy Low-fat (1%) or fat-free (skim) milk. Fat-free, low-fat, or reduced-fat cheeses. Nonfat, low-sodium ricotta or cottage cheese. Low-fat or nonfat yogurt. Low-fat, low-sodium cheese. Fats and oils Soft margarine without trans fats. Vegetable oil. Low-fat, reduced-fat, or  light mayonnaise and salad dressings (reduced-sodium). Canola, safflower, olive, soybean, and sunflower oils. Avocado. Seasoning and other foods Herbs. Spices. Seasoning mixes without salt. Unsalted popcorn and pretzels. Fat-free sweets. What foods are not recommended? The items listed may not be a complete list. Talk with your dietitian about what dietary choices are best for you. Grains Baked goods made with fat, such as croissants, muffins, or some breads. Dry pasta or rice meal packs. Vegetables Creamed or fried vegetables. Vegetables in a cheese sauce. Regular canned vegetables (not low-sodium or reduced-sodium). Regular canned tomato sauce and paste (not low-sodium or reduced-sodium). Regular tomato and vegetable juice (not low-sodium or reduced-sodium). Angie Fava. Olives. Fruits Canned fruit in a light or heavy syrup. Fried fruit. Fruit in cream or butter sauce. Meat and other  protein foods Fatty cuts of meat. Ribs. Fried meat. Berniece Salines. Sausage. Bologna and other processed lunch meats. Salami. Fatback. Hotdogs. Bratwurst. Salted nuts and seeds. Canned beans with added salt. Canned or smoked fish. Whole eggs or egg yolks. Chicken or [place] with skin. Dairy Whole or 2% milk, cream, and half-and-half. Whole or full-fat cream cheese. Whole-fat or sweetened yogurt. Full-fat cheese. Nondairy creamers. Whipped toppings. Processed cheese and cheese spreads. Fats and oils Butter. Stick margarine. Lard. Shortening. Ghee. Bacon fat. Tropical oils, such as coconut, palm kernel, or palm oil. Seasoning and other foods Salted popcorn and pretzels. Onion salt, garlic salt, seasoned salt, table salt, and sea salt. Worcestershire sauce. Tartar sauce. Barbecue sauce. Teriyaki sauce. Soy sauce, including reduced-sodium. Steak sauce. Canned and packaged gravies. Fish sauce. Oyster sauce. Cocktail sauce. Horseradish that you find on the shelf. Ketchup. Mustard. Meat flavorings and tenderizers. Bouillon cubes. Hot sauce and Tabasco sauce. Premade or packaged marinades. Premade or packaged taco seasonings. Relishes. Regular salad dressings. Where to find more information:  National Heart, Lung, and [place]: https://wilson-eaton.com/  American Heart Association: www.heart.org Summary  The DASH eating plan is a healthy eating plan that has been shown to reduce high blood pressure (hypertension). It may also reduce your risk for type 2 diabetes, heart disease, and stroke.  With the DASH eating plan, you should limit salt (sodium) intake to 2,300 mg a day. If you have hypertension, you may need to reduce your sodium intake to 1,500 mg a day.  When on the DASH eating plan, aim to eat more fresh fruits and vegetables, whole grains, lean proteins, low-fat dairy, and heart-healthy fats.  Work with your health care provider or diet and nutrition specialist (dietitian) to adjust your eating plan to your  individual calorie needs. This information is not intended to replace advice given to you by your health care provider. Make sure you discuss any questions you have with your health care provider. Document Released: 10/19/2011 Document Revised: 10/23/2016 Document Reviewed: 10/23/2016 Elsevier Interactive Patient Education  [employer].

## 2018-01-07 NOTE — Progress Notes (Signed)
Sherry Hardy is a 47 y.o. female who is here after 5 days of cough and cold symptoms. She reports being exposed to daughter last Monday who began exhibiting symptoms and was out of school all of last week.She has attempted to treat with condition with Mucinex max with minimal improvement. She reports using Afrin with some improvement in the last 24 hours. She is concerned due to a history of pleurisy and also has untreated hypertension which she manages with diet and exercise. She reports cough and congestion as the dominant symptom. She denies a history of smoking and asthma.  Review of Systems  Constitutional: Positive for fever. Negative for chills, diaphoresis, malaise/fatigue and weight loss.  HENT: Positive for congestion and sinus pain. Negative for ear discharge, ear pain, hearing loss, nosebleeds, sore throat and tinnitus.   Eyes: Negative for blurred vision, double vision, photophobia, pain, discharge and redness.  Respiratory: Positive for cough, hemoptysis and shortness of breath. Negative for sputum production, wheezing and stridor.   Cardiovascular: Negative for chest pain, palpitations, orthopnea, claudication and leg swelling.  Gastrointestinal: Positive for nausea and vomiting. Negative for abdominal pain, blood in stool, constipation, diarrhea and heartburn.  Genitourinary: Negative for dysuria, frequency and urgency.  Musculoskeletal: Positive for myalgias. Negative for back pain and neck pain.  Skin: Negative for itching and rash.  Neurological: Positive for tingling. Negative for dizziness, weakness and headaches.  Endo/Heme/Allergies: Negative for environmental allergies. Bruises/bleeds easily.  Psychiatric/Behavioral: Positive for suicidal ideas. Negative for depression.   Physical Exam  Constitutional: She is oriented to person, place, and time. She appears well-developed and well-nourished. She is active.  HENT:  Head: Normocephalic and atraumatic.  Right Ear:  Hearing, tympanic membrane, external ear and ear canal normal.  Left Ear: Hearing, tympanic membrane, external ear and ear canal normal.  Nose: Rhinorrhea present. No mucosal edema.  Mouth/Throat: Uvula is midline, oropharynx is clear and moist and mucous membranes are normal. She does not have dentures. Normal dentition. No oropharyngeal exudate.  Mild nasal erythema  Eyes: Pupils are equal, round, and reactive to light.  Cardiovascular: Normal rate, regular rhythm, normal heart sounds and intact distal pulses. Exam reveals no friction rub.  No murmur heard. Pulmonary/Chest: Breath sounds normal. No stridor. No apnea and no tachypnea. No respiratory distress. She has no wheezes. She has no rales. She exhibits no tenderness.  No evidence of adventitious lung sounds or concerns for pleurisy at this time on exam.  Abdominal: Soft. Bowel sounds are normal. She exhibits no distension and no mass. There is no tenderness. There is no guarding.  Musculoskeletal: Normal range of motion. She exhibits no edema.  Neurological: She is alert and oriented to person, place, and time.  Skin: Skin is warm and dry. No rash noted. No erythema. There is pallor.   A: Vitals:   01/07/18 0848  BP: (!) 152/98  Pulse: 94  Resp: 20  Temp: 98.9 F (37.2 C)  SpO2: 99%   1. Cough   2. Elevated blood pressure reading       PLAN:  1. Cough Take Singulair nightly for 7-14 days for cough Take: Claritin every morning for 7-14 days to help dry up secretions Take Tessalon Pearls up to 3 times daily every 8 hours with full glass of water as needed for cough Follow Up: for emergent concerns or if cough persists beyond 10 day period withOUT improvement  2. Elevated blood pressure reading Information provided on DASH diet (hypertension and cough) Advised to limit  OTC combination cough preparations as these can increase blood pressure. Follow up with PCP for routine evaluation and treatment .  Meds ordered this  encounter  Medications  . DISCONTD: benzonatate (TESSALON) 100 MG capsule    Sig: Take 1-2 capsules (100-200 mg total) by mouth 3 (three) times daily as needed for cough (with full glass of water).    Dispense:  20 capsule    Refill:  0  . montelukast (SINGULAIR) 10 MG tablet    Sig: Take 1 tablet (10 mg total) by mouth at bedtime for 14 days.    Dispense:  14 tablet    Refill:  0  . loratadine (CLARITIN) 10 MG tablet    Sig: Take 1 tablet (10 mg total) by mouth daily.    Dispense:  14 tablet    Refill:  0  . benzonatate (TESSALON) 100 MG capsule    Sig: Take 1-2 capsules (100-200 mg total) by mouth 3 (three) times daily as needed for cough (with full glass of water).    Dispense:  20 capsule    Refill:  0

## 2018-01-23 ENCOUNTER — Ambulatory Visit: Payer: Self-pay | Admitting: Family Medicine

## 2018-04-04 ENCOUNTER — Other Ambulatory Visit: Payer: Self-pay | Admitting: Obstetrics and Gynecology

## 2018-04-04 DIAGNOSIS — Z1231 Encounter for screening mammogram for malignant neoplasm of breast: Secondary | ICD-10-CM

## 2018-04-04 DIAGNOSIS — I10 Essential (primary) hypertension: Secondary | ICD-10-CM | POA: Diagnosis not present

## 2018-04-04 DIAGNOSIS — Z01419 Encounter for gynecological examination (general) (routine) without abnormal findings: Secondary | ICD-10-CM | POA: Diagnosis not present

## 2018-04-04 DIAGNOSIS — Z6822 Body mass index (BMI) 22.0-22.9, adult: Secondary | ICD-10-CM | POA: Diagnosis not present

## 2018-04-19 ENCOUNTER — Other Ambulatory Visit: Payer: Self-pay

## 2018-04-19 ENCOUNTER — Encounter (HOSPITAL_COMMUNITY): Payer: Self-pay | Admitting: Emergency Medicine

## 2018-04-19 ENCOUNTER — Emergency Department (HOSPITAL_COMMUNITY): Payer: 59

## 2018-04-19 ENCOUNTER — Emergency Department (HOSPITAL_COMMUNITY)
Admission: EM | Admit: 2018-04-19 | Discharge: 2018-04-20 | Disposition: A | Discharge status: Home / Self Care | Payer: 59 | Attending: Emergency Medicine | Admitting: Emergency Medicine

## 2018-04-19 DIAGNOSIS — E876 Hypokalemia: Secondary | ICD-10-CM | POA: Diagnosis not present

## 2018-04-19 DIAGNOSIS — I209 Angina pectoris, unspecified: Secondary | ICD-10-CM | POA: Diagnosis not present

## 2018-04-19 DIAGNOSIS — Z23 Encounter for immunization: Secondary | ICD-10-CM | POA: Diagnosis not present

## 2018-04-19 DIAGNOSIS — Z79899 Other long term (current) drug therapy: Secondary | ICD-10-CM | POA: Insufficient documentation

## 2018-04-19 DIAGNOSIS — R079 Chest pain, unspecified: Secondary | ICD-10-CM | POA: Diagnosis present

## 2018-04-19 DIAGNOSIS — R0789 Other chest pain: Secondary | ICD-10-CM | POA: Diagnosis not present

## 2018-04-19 DIAGNOSIS — R0602 Shortness of breath: Secondary | ICD-10-CM | POA: Diagnosis not present

## 2018-04-19 DIAGNOSIS — I10 Essential (primary) hypertension: Secondary | ICD-10-CM | POA: Diagnosis not present

## 2018-04-19 DIAGNOSIS — I160 Hypertensive urgency: Secondary | ICD-10-CM | POA: Diagnosis not present

## 2018-04-19 HISTORY — DX: Essential (primary) hypertension: I10

## 2018-04-19 LAB — BASIC METABOLIC PANEL
Anion gap: 10 (ref 5–15)
BUN: 13 mg/dL (ref 6–20)
CALCIUM: 9.9 mg/dL (ref 8.9–10.3)
CO2: 26 mmol/L (ref 22–32)
CREATININE: 0.73 mg/dL (ref 0.44–1.00)
Chloride: 102 mmol/L (ref 101–111)
GFR calc Af Amer: >60 mL/min (ref >60)
Glucose, Bld: 100 mg/dL — ABNORMAL HIGH (ref 65–99)
Potassium: 3.3 mmol/L — ABNORMAL LOW (ref 3.5–5.1)
Sodium: 138 mmol/L (ref 135–145)

## 2018-04-19 LAB — CBC
HCT: 47 % — ABNORMAL HIGH (ref 36.0–46.0)
Hemoglobin: 16.6 g/dL — ABNORMAL HIGH (ref 12.0–15.0)
MCH: 30.4 pg (ref 26.0–34.0)
MCHC: 35.3 g/dL (ref 30.0–36.0)
MCV: 86.1 fL (ref 78.0–100.0)
PLATELETS: 283 10*3/uL (ref 150–400)
RBC: 5.46 MIL/uL — ABNORMAL HIGH (ref 3.87–5.11)
RDW: 11.9 % (ref 11.5–15.5)
WBC: 11.1 10*3/uL — ABNORMAL HIGH (ref 4.0–10.5)

## 2018-04-19 LAB — I-STAT TROPONIN, ED: Troponin i, poc: 0.02 ng/mL (ref 0.00–0.08)

## 2018-04-19 LAB — I-STAT BETA HCG BLOOD, ED (MC, WL, AP ONLY)

## 2018-04-19 NOTE — ED Triage Notes (Signed)
C/o intermittent pressure to center of chest x 2-3 days with constant pain over the past 2 hours.  Also reports tingling in L fingers, sob, and nausea.  Seen by PCP earlier today for same.  Taking HCTZ x 2 weeks.

## 2018-04-20 LAB — I-STAT TROPONIN, ED: Troponin i, poc: 0.01 ng/mL (ref 0.00–0.08)

## 2018-04-20 MED ORDER — POTASSIUM CHLORIDE CRYS ER 20 MEQ PO TBCR
40.0000 meq | EXTENDED_RELEASE_TABLET | Freq: Once | ORAL | Status: AC
  Administered 2018-04-20: 40 meq via ORAL
  Filled 2018-04-20: qty 2

## 2018-04-20 MED ORDER — CLONIDINE HCL 0.1 MG PO TABS: 0.1000 mg | ORAL_TABLET | Freq: Once | ORAL | Status: DC

## 2018-04-20 MED ORDER — CLONIDINE HCL 0.1 MG PO TABS
0.1000 mg | ORAL_TABLET | Freq: Once | ORAL | Status: AC
  Administered 2018-04-20: 0.1 mg via ORAL
  Filled 2018-04-20: qty 1

## 2018-04-20 NOTE — ED Notes (Signed)
Pt discharged from ED; instructions provided; Pt encouraged to return to ED if symptoms worsen and to f/u with PCP; Pt verbalized understanding of all instructions 

## 2018-04-20 NOTE — ED Provider Notes (Signed)
[facility] DEPART[facility] Provider Note   CSN: [id number] Arrival date & time: 04/19/18  2251     History   Chief Complaint Chief Complaint  Patient presents with  . Chest Pain    HPI Sherry Hardy is a 47 y.o. female.  HPI 47 year old female with past medical history of hypertension here with intermittent chest pain and shortness of breath.  The patient states that for the last 3 weeks, she has had progressively worsening shortness of breath that has been constant.  She is noticed that she becomes more short of breath with walking.  Over the last week, she notes significant increased stressors at home.  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  She reports that over this last week, she is noticed that her blood pressure has been increasing.  She saw her doctor 3 weeks ago with the onset of shortness of breath and was given hydrochlorothiazide, which she has been taking.  Over the last week, however, she feels like she is been having intermittent retrobulbar headaches with a dull, aching, substernal chest pressure.  This seems to correlate with increased stress.  She denies any ongoing pain, though she does note her shortness of breath has persisted.  No lower extremity swelling.  No history of DVT or PE.  She is been taking her meds as prescribed.  No early family history of coronary disease.  She does not smoke.  No history of other cardiac risk factors.  Past Medical History:  Diagnosis Date  . Anxiety   . Hypertension     There are no active problems to display for this patient.   Past Surgical History:  Procedure Laterality Date  . WISDOM TOOTH EXTRACTION       OB History    Gravida  5   Para  3   Term  3   Preterm      AB  2   Living  3     SAB  1   TAB  1   Ectopic      Multiple      Live Births  1            Home Medications    Prior to Admission medications     Medication Sig Start Date End Date Taking? Authorizing Provider  aspirin-acetaminophen-caffeine (EXCEDRIN MIGRAINE) [phone] MG tablet Take 2 tablets by mouth every 8 (eight) hours as needed for headache.   Yes [provider]  ibuprofen (ADVIL,MOTRIN) 600 MG tablet Take 1 tablet (600 mg total) by mouth every 6 (six) hours as needed for pain. 09/15/12  Yes Everlene Farrier, MD  loratadine (CLARITIN) 10 MG tablet Take 1 tablet (10 mg total) by mouth daily. Patient taking differently: Take 10 mg by mouth daily as needed for allergies.  01/07/18  Yes Shella Maxim, NP  meclizine (ANTIVERT) 25 MG tablet Take 1 tablet (25 mg total) by mouth 3 (three) times daily as needed for dizziness. 08/08/16  Yes Elveria Rising, MD  Vitamin D, Ergocalciferol, (DRISDOL) 50000 units CAPS capsule Take 50,000 Units by mouth every 7 (seven) days.   Yes [provider]  hydrochlorothiazide (HYDRODIURIL) 25 MG tablet Take 12.5 mg by mouth daily.    [provider]    Family History Family History  Problem Relation Age of Onset  . Other Neg Hx     Social History Social History  Tobacco Use  . Smoking status: Never Smoker  . Smokeless tobacco: Never Used  Substance Use Topics  . Alcohol use: No  . Drug use: No     Allergies   Patient has no known allergies.   Review of Systems Review of Systems  Constitutional: Negative for chills and fever.  HENT: Negative for congestion, rhinorrhea and sore throat.   Eyes: Negative for visual disturbance.  Respiratory: Positive for chest tightness and shortness of breath. Negative for cough and wheezing.   Cardiovascular: Negative for chest pain and leg swelling.  Gastrointestinal: Negative for abdominal pain, diarrhea, nausea and vomiting.  Genitourinary: Negative for dysuria, flank pain, vaginal bleeding and vaginal discharge.  Musculoskeletal: Negative for neck pain.  Skin: Negative for rash.  Allergic/Immunologic: Negative for  immunocompromised state.  Neurological: Positive for headaches. Negative for syncope.  Hematological: Does not bruise/bleed easily.  All other systems reviewed and are negative.    Physical Exam Updated Vital Signs BP 123/90   Pulse 83   Temp 97.8 F (36.6 C) (Oral)   Resp 14   LMP 03/29/2018   SpO2 99%   Physical Exam  Constitutional: She is oriented to person, place, and time. She appears well-developed and well-nourished. No distress.  HENT:  Head: Normocephalic and atraumatic.  Eyes: Conjunctivae are normal.  Neck: Neck supple.  Cardiovascular: Normal rate, regular rhythm and normal heart sounds. Exam reveals no friction rub.  No murmur heard. Pulmonary/Chest: Effort normal and breath sounds normal. No respiratory distress. She has no wheezes. She has no rales.  Abdominal: She exhibits no distension.  Musculoskeletal: She exhibits no edema.  Neurological: She is alert and oriented to person, place, and time. She has normal strength. No cranial nerve deficit or sensory deficit. She exhibits normal muscle tone. Gait normal. GCS eye subscore is 4. GCS verbal subscore is 5. GCS motor subscore is 6.  Skin: Skin is warm. Capillary refill takes less than 2 seconds.  Psychiatric: She has a normal mood and affect.  Nursing note and vitals reviewed.    ED Treatments / Results  Labs (all labs ordered are listed, but only abnormal results are displayed) Labs Reviewed  BASIC METABOLIC PANEL - Abnormal; Notable for the following components:      Result Value   Potassium 3.3 (*)    Glucose, Bld 100 (*)    All other components within normal limits  CBC - Abnormal; Notable for the following components:   WBC 11.1 (*)    RBC 5.46 (*)    Hemoglobin 16.6 (*)    HCT 47.0 (*)    All other components within normal limits  I-STAT TROPONIN, ED  I-STAT BETA HCG BLOOD, ED ([facility], [facility], [facility] ONLY)  I-STAT TROPONIN, ED    EKG EKG Interpretation  Date/Time:  Friday April 19 2018 22:55:49  EDT Ventricular Rate:  101 PR Interval:  212 QRS Duration: 72 QT Interval:  362 QTC Calculation: 469 R Axis:   31 Text Interpretation:  Sinus tachycardia with 1st degree A-V block Nonspecific ST abnormality Abnormal ECG No significant change since last tracing Confirmed by Duffy Bruce [phone]) on 04/20/2018 5:59:04 AM   Radiology Dg Chest 2 View  Result Date: 04/19/2018 CLINICAL DATA:  Intermittent central chest pressure 2-3 days with constant pain over the past 2 hours. Shortness of breath and nausea. EXAM: CHEST - 2 VIEW COMPARISON:  08/08/2016 FINDINGS: Lungs are adequately inflated without consolidation or effusion. Cardiomediastinal silhouette is within normal. Bones and soft tissues are unremarkable.  IMPRESSION: No active cardiopulmonary disease. Electronically Signed   By: Marin Olp M.D.   On: 04/19/2018 23:37    Procedures Procedures (including critical care time)  Medications Ordered in ED Medications  cloNIDine (CATAPRES) tablet 0.1 mg (0.1 mg Oral Given 04/20/18 0616)  potassium chloride SA (K-DUR,KLOR-CON) CR tablet 40 mEq (40 mEq Oral Given 04/20/18 0802)     Initial Impression / Assessment and Plan / ED Course  I have reviewed the triage vital signs and the nursing notes.  Pertinent labs & imaging results that were available during my care of the patient were reviewed by me and considered in my medical decision making (see chart for details).     47 year old female here with intermittent chest pain and shortness of breath as well as retrobulbar headaches in setting of increasing blood pressure in the setting of multiple stressors.  I suspect she has mild symptomatic hypertension/hypertensive urgency.  Her EKG is nonischemic and troponins are negative x2 despite constant symptoms and I do not suspect ACS.  She has no symptoms of PE.  She was given a p.o. antihypertensive here with excellent resolution of her symptoms.  They seem to be correlating with increased stressors  at home which I suspect is causing her increase and baseline hypertension.  Discussed that she needs to start taking her medications as prescribed as well as close follow-up.  I have replaced her potassium.  She was prescribed HCTZ but is now on valsartan which should help with her potassium.  Advised her to return with any new or worsening symptoms.  No current headache or neurological deficits.  Final Clinical Impressions(s) / ED Diagnoses   Final diagnoses:  Hypertensive urgency  Hypokalemia    ED Discharge Orders    None       Duffy Bruce, MD 04/20/18 [phone]

## 2018-04-20 NOTE — Discharge Instructions (Addendum)
As we talked about, I suspect her symptoms are due to increased blood pressure.  I recommend starting the new medication prescribed by your doctor today.  Otherwise, your lab work was very reassuring.  Your potassium was mildly low.  This should improve with replacement here, but it is important to have this rechecked within the next week at your doctor, as the blood pressure medication can affect this.

## 2018-04-23 DIAGNOSIS — I209 Angina pectoris, unspecified: Secondary | ICD-10-CM | POA: Diagnosis not present

## 2018-04-23 DIAGNOSIS — I10 Essential (primary) hypertension: Secondary | ICD-10-CM | POA: Diagnosis not present

## 2018-04-23 DIAGNOSIS — E876 Hypokalemia: Secondary | ICD-10-CM | POA: Diagnosis not present

## 2018-04-23 DIAGNOSIS — I160 Hypertensive urgency: Secondary | ICD-10-CM | POA: Diagnosis not present

## 2018-04-25 ENCOUNTER — Ambulatory Visit
Admission: RE | Admit: 2018-04-25 | Discharge: 2018-04-25 | Disposition: A | Discharge status: Home / Self Care | Payer: 59 | Source: Ambulatory Visit | Attending: Obstetrics and Gynecology | Admitting: Obstetrics and Gynecology

## 2018-04-25 DIAGNOSIS — E782 Mixed hyperlipidemia: Secondary | ICD-10-CM | POA: Diagnosis not present

## 2018-04-25 DIAGNOSIS — Z1231 Encounter for screening mammogram for malignant neoplasm of breast: Secondary | ICD-10-CM

## 2018-04-25 DIAGNOSIS — Z0189 Encounter for other specified special examinations: Secondary | ICD-10-CM | POA: Diagnosis not present

## 2018-04-25 DIAGNOSIS — I10 Essential (primary) hypertension: Secondary | ICD-10-CM | POA: Diagnosis not present

## 2018-05-07 DIAGNOSIS — E559 Vitamin D deficiency, unspecified: Secondary | ICD-10-CM | POA: Diagnosis not present

## 2018-05-07 DIAGNOSIS — E782 Mixed hyperlipidemia: Secondary | ICD-10-CM | POA: Diagnosis not present

## 2018-05-07 DIAGNOSIS — I10 Essential (primary) hypertension: Secondary | ICD-10-CM | POA: Diagnosis not present

## 2018-05-10 DIAGNOSIS — I10 Essential (primary) hypertension: Secondary | ICD-10-CM | POA: Diagnosis not present

## 2018-05-10 DIAGNOSIS — E782 Mixed hyperlipidemia: Secondary | ICD-10-CM | POA: Diagnosis not present

## 2018-05-10 DIAGNOSIS — E559 Vitamin D deficiency, unspecified: Secondary | ICD-10-CM | POA: Diagnosis not present

## 2018-07-17 DIAGNOSIS — Z1283 Encounter for screening for malignant neoplasm of skin: Secondary | ICD-10-CM | POA: Diagnosis not present

## 2018-07-17 DIAGNOSIS — D225 Melanocytic nevi of trunk: Secondary | ICD-10-CM | POA: Diagnosis not present

## 2018-07-23 DIAGNOSIS — Z0000 Encounter for general adult medical examination without abnormal findings: Secondary | ICD-10-CM | POA: Diagnosis not present

## 2018-07-29 DIAGNOSIS — Z0000 Encounter for general adult medical examination without abnormal findings: Secondary | ICD-10-CM | POA: Diagnosis not present

## 2018-07-29 DIAGNOSIS — Z23 Encounter for immunization: Secondary | ICD-10-CM | POA: Diagnosis not present

## 2018-07-29 DIAGNOSIS — Z6821 Body mass index (BMI) 21.0-21.9, adult: Secondary | ICD-10-CM | POA: Diagnosis not present

## 2018-11-04 DIAGNOSIS — E782 Mixed hyperlipidemia: Secondary | ICD-10-CM | POA: Diagnosis not present

## 2018-11-07 DIAGNOSIS — E782 Mixed hyperlipidemia: Secondary | ICD-10-CM | POA: Diagnosis not present

## 2018-11-07 DIAGNOSIS — I10 Essential (primary) hypertension: Secondary | ICD-10-CM | POA: Diagnosis not present

## 2018-11-07 DIAGNOSIS — Z6822 Body mass index (BMI) 22.0-22.9, adult: Secondary | ICD-10-CM | POA: Diagnosis not present

## 2019-01-14 IMAGING — DX DG CHEST 2V
2 series · 2 of 2 positions shown · non-contrast
Comparison: 08/08/2016

CLINICAL DATA: Intermittent central chest pressure 2-3 days with
constant pain over the past 2 hours. Shortness of breath and nausea.

EXAM:
CHEST - 2 VIEW

[chest pa]
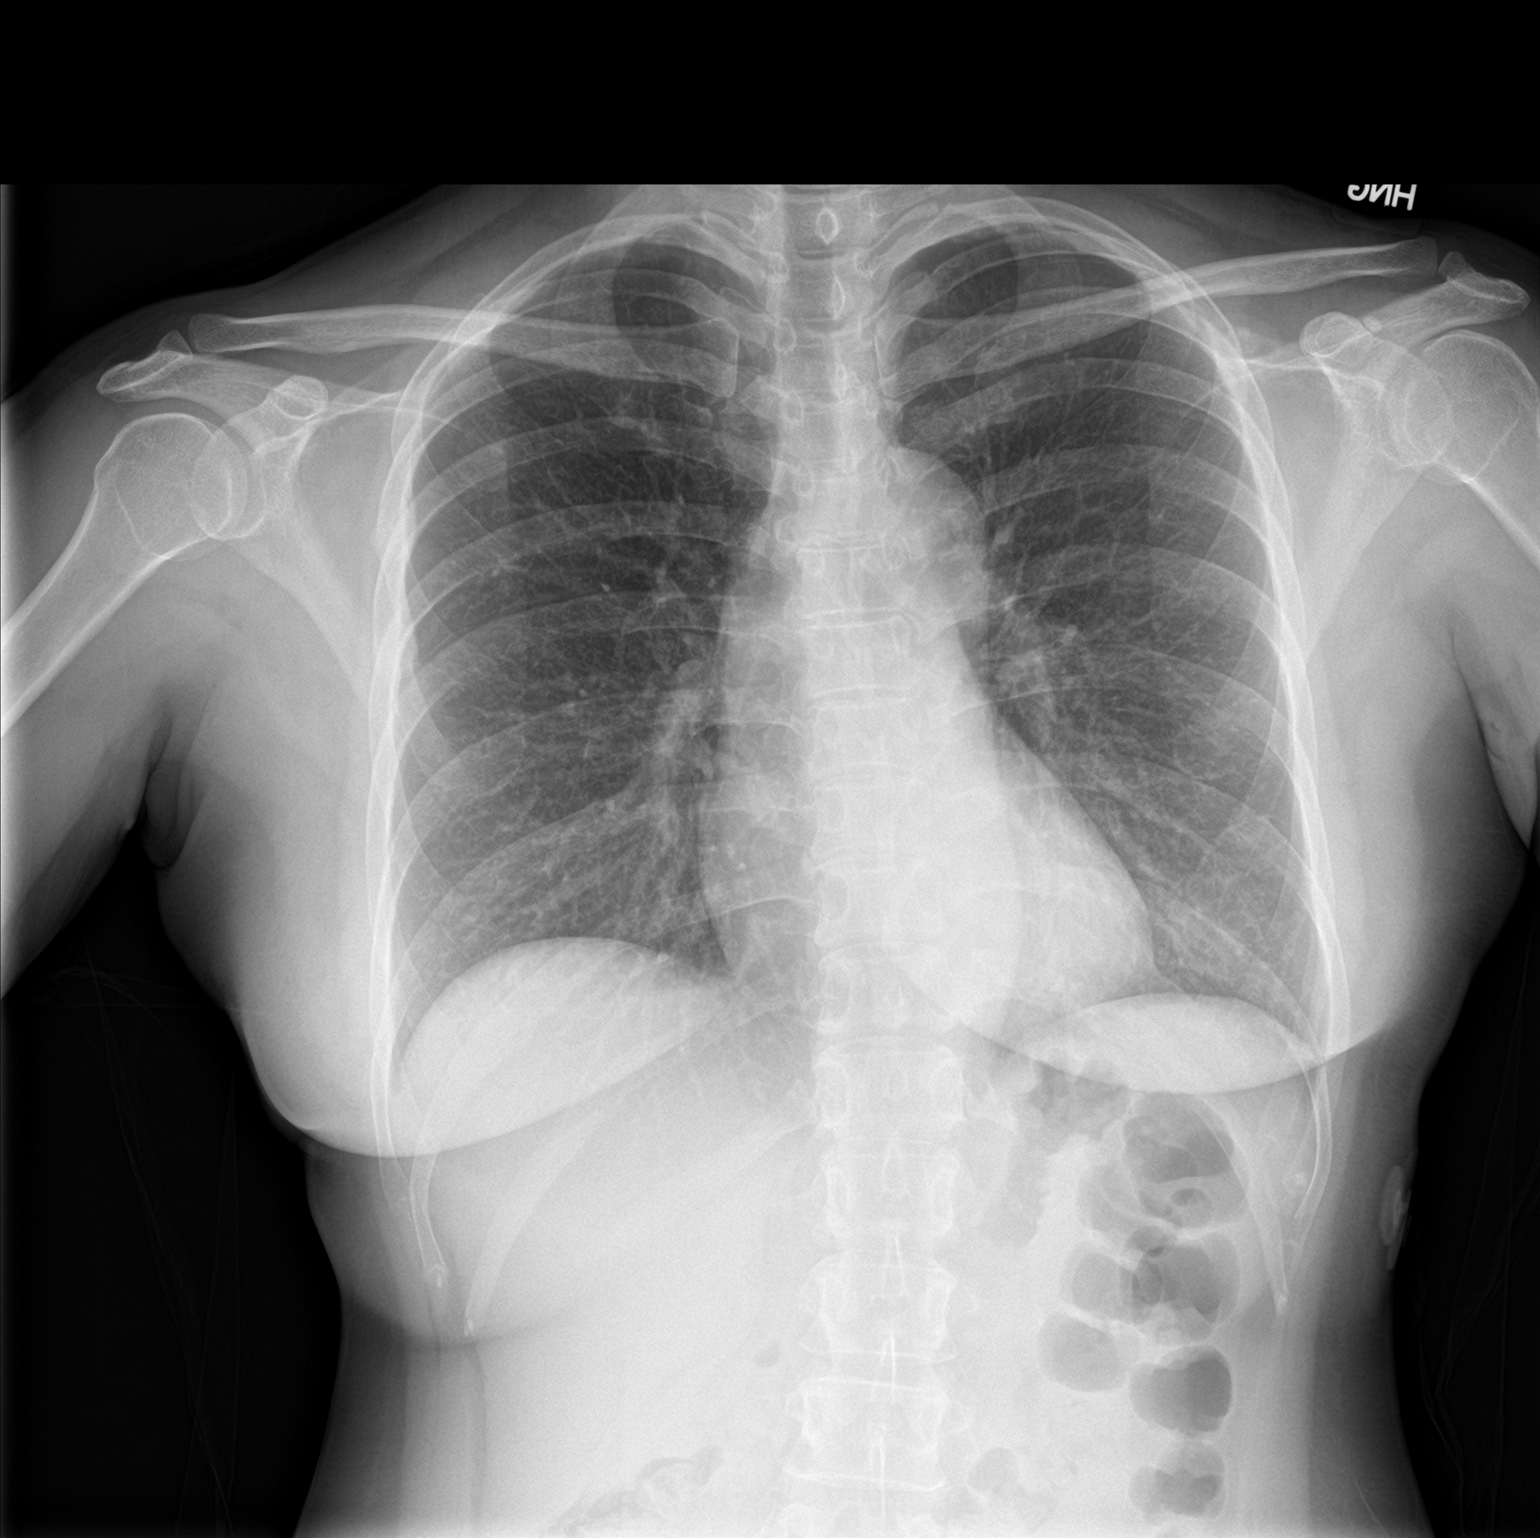

[chest lat]
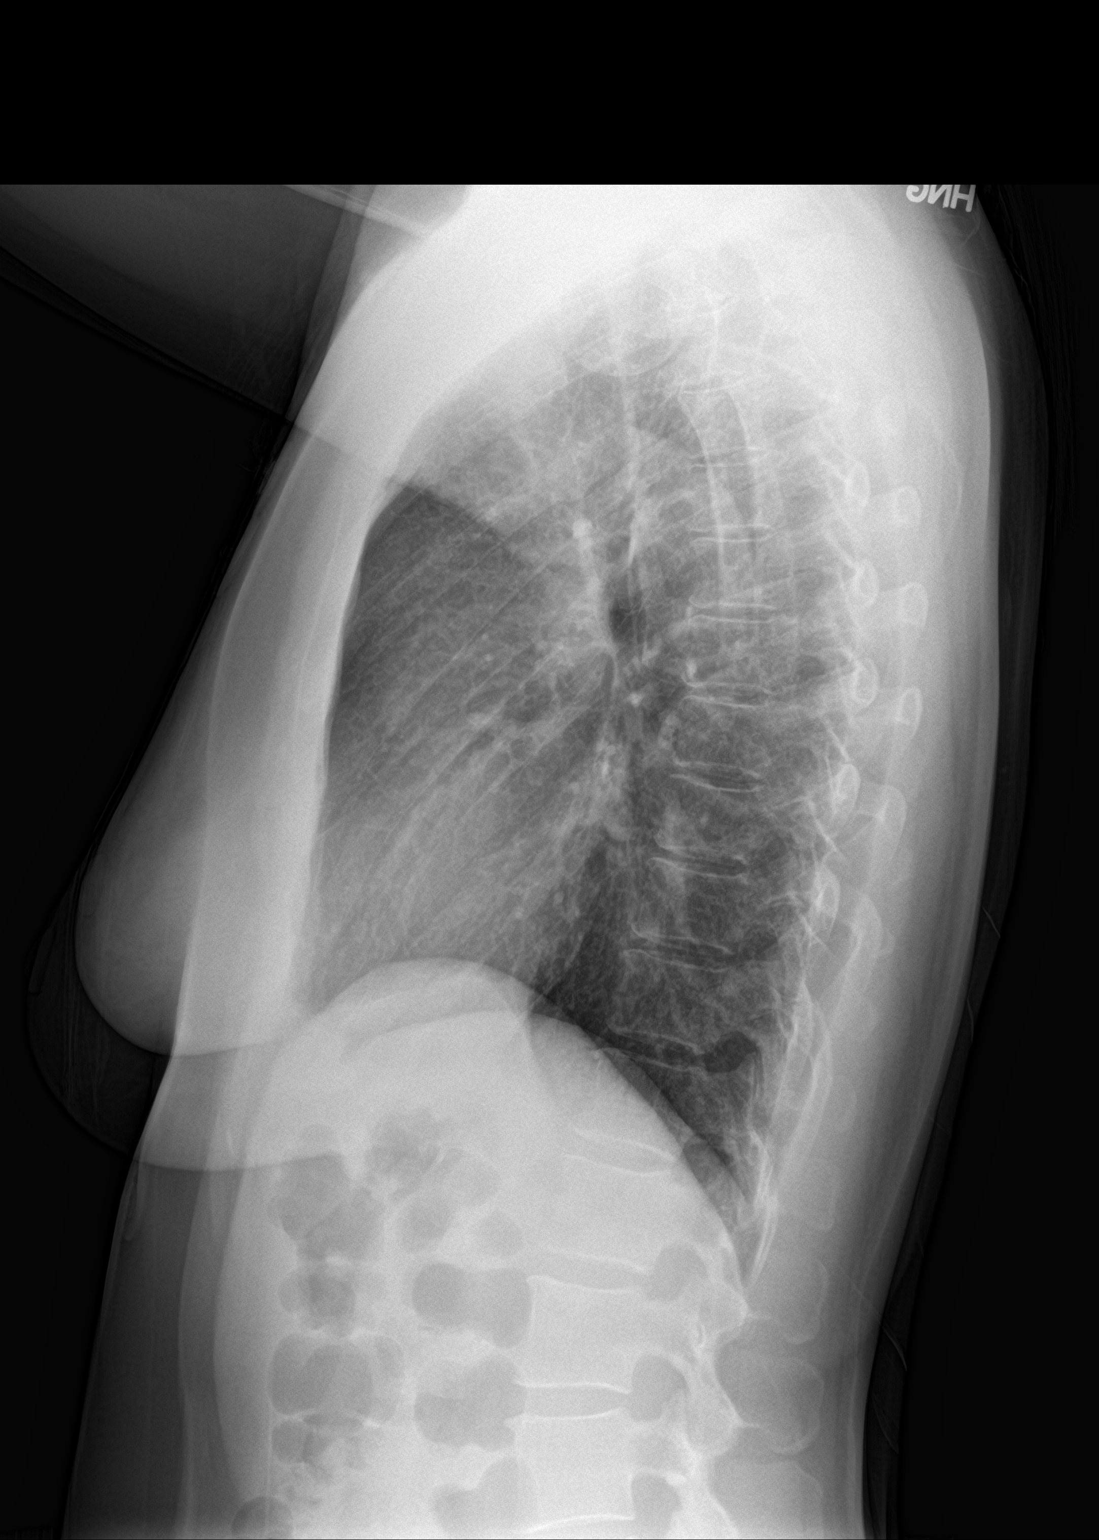

[2 of 2 positions shown; findings below may reference images not displayed]

FINDINGS: Lungs are adequately inflated without consolidation or effusion.
Cardiomediastinal silhouette is within normal. Bones and soft
tissues are unremarkable.
IMPRESSION: No active cardiopulmonary disease.

## 2019-01-31 DIAGNOSIS — E559 Vitamin D deficiency, unspecified: Secondary | ICD-10-CM | POA: Diagnosis not present

## 2019-01-31 DIAGNOSIS — R946 Abnormal results of thyroid function studies: Secondary | ICD-10-CM | POA: Diagnosis not present

## 2019-01-31 DIAGNOSIS — I10 Essential (primary) hypertension: Secondary | ICD-10-CM | POA: Diagnosis not present

## 2019-04-08 DIAGNOSIS — Z01419 Encounter for gynecological examination (general) (routine) without abnormal findings: Secondary | ICD-10-CM | POA: Diagnosis not present

## 2019-04-08 DIAGNOSIS — Z6821 Body mass index (BMI) 21.0-21.9, adult: Secondary | ICD-10-CM | POA: Diagnosis not present

## 2020-01-20 ENCOUNTER — Other Ambulatory Visit: Payer: Self-pay | Admitting: Obstetrics and Gynecology

## 2020-01-20 DIAGNOSIS — N6320 Unspecified lump in the left breast, unspecified quadrant: Secondary | ICD-10-CM

## 2020-02-03 ENCOUNTER — Ambulatory Visit
Admission: RE | Admit: 2020-02-03 | Discharge: 2020-02-03 | Disposition: A | Discharge status: Home / Self Care | Payer: 59 | Source: Ambulatory Visit | Attending: Obstetrics and Gynecology | Admitting: Obstetrics and Gynecology

## 2020-02-03 ENCOUNTER — Other Ambulatory Visit: Payer: Self-pay

## 2020-02-03 ENCOUNTER — Ambulatory Visit: Payer: 59

## 2020-02-03 DIAGNOSIS — N6320 Unspecified lump in the left breast, unspecified quadrant: Secondary | ICD-10-CM

## 2020-04-30 ENCOUNTER — Other Ambulatory Visit (HOSPITAL_COMMUNITY): Payer: Self-pay | Admitting: Family Medicine

## 2020-04-30 DIAGNOSIS — I6523 Occlusion and stenosis of bilateral carotid arteries: Secondary | ICD-10-CM

## 2020-05-04 ENCOUNTER — Ambulatory Visit (HOSPITAL_COMMUNITY)
Admission: RE | Admit: 2020-05-04 | Discharge: 2020-05-04 | Disposition: A | Discharge status: Home / Self Care | Payer: 59 | Source: Ambulatory Visit | Attending: Cardiovascular Disease | Admitting: Cardiovascular Disease

## 2020-05-04 ENCOUNTER — Other Ambulatory Visit: Payer: Self-pay

## 2020-05-04 DIAGNOSIS — I6523 Occlusion and stenosis of bilateral carotid arteries: Secondary | ICD-10-CM | POA: Diagnosis present

## 2020-05-18 ENCOUNTER — Ambulatory Visit: Payer: 59 | Admitting: Vascular Surgery

## 2020-05-18 ENCOUNTER — Encounter: Payer: Self-pay | Admitting: Vascular Surgery

## 2020-05-18 ENCOUNTER — Other Ambulatory Visit: Payer: Self-pay

## 2020-05-18 VITALS — BP 124/84 | HR 70 | Temp 98.2°F | Resp 20 | Ht 59.0 in | Wt 109.6 lb

## 2020-05-18 DIAGNOSIS — I771 Stricture of artery: Secondary | ICD-10-CM | POA: Diagnosis not present

## 2020-05-18 NOTE — Progress Notes (Signed)
[facility]ist of [facility]  Patient name: Sherry Hardy MRN: [id number] DOB: 01/11/1971 Sex: female  REASON FOR CONSULT: Evaluation left subclavian stenosis  HPI: Sherry Hardy is a 49 y.o. female, who is here today for evaluation.  She was found to have a left carotid bruit and underwent duplex on 05/04/2020 for further evaluation.  This showed no evidence of carotid disease.  She was found to have turbulent flow in her left subclavian artery with possible proximal stenosis.  She is here today for further discussion of this.  She does not have any prior history of stroke.  She has no history of vertebrobasilar insufficiency symptoms.  No dizziness or unsteady gait.  She does report occasional right arm symptoms with some numbness with positioning that sounds like transient peripheral nerve compression.  She has no history of cardiac disease.  Past Medical History:  Diagnosis Date  . Anxiety   . Hypertension     Family History  Problem Relation Age of Onset  . Other Neg Hx     SOCIAL HISTORY: Social History   Socioeconomic History  . Marital status: Married    Spouse name: Not on file  . Number of children: Not on file  . Years of education: Not on file  . Highest education level: Not on file  Occupational History  . Not on file  Tobacco Use  . Smoking status: Never Smoker  . Smokeless tobacco: Never Used  Vaping Use  . Vaping Use: Never used  Substance and Sexual Activity  . Alcohol use: No  . Drug use: No  . Sexual activity: Yes    Birth control/protection: None  Other Topics Concern  . Not on file  Social History Narrative  . Not on file   Social Determinants of Health   Financial Resource Strain:   . Difficulty of Paying Living Expenses:   Food Insecurity:   . Worried About Charity fundraiser in the Last Year:   . Arboriculturist in the Last Year:   Transportation Needs:   . Lexicographer (Medical):   Marland Kitchen Lack of Transportation (Non-Medical):   Physical Activity:   . Days of Exercise per Week:   . Minutes of Exercise per Session:   Stress:   . Feeling of Stress :   Social Connections:   . Frequency of Communication with Friends and Family:   . Frequency of Social Gatherings with Friends and Family:   . Attends Religious Services:   . Active Member of Clubs or Organizations:   . Attends Archivist Meetings:   Marland Kitchen Marital Status:   Intimate Partner Violence:   . Fear of Current or Ex-Partner:   . Emotionally Abused:   Marland Kitchen Physically Abused:   . Sexually Abused:     No Known Allergies  Current Outpatient Medications  Medication Sig Dispense Refill  . aspirin-acetaminophen-caffeine (EXCEDRIN MIGRAINE) 250-250-65 MG tablet Take 2 tablets by mouth every 8 (eight) hours as needed for headache.    . ibuprofen (ADVIL,MOTRIN) 600 MG tablet Take 1 tablet (600 mg total) by mouth every 6 (six) hours as needed for pain. 30 tablet 3  . meclizine (ANTIVERT) 25 MG tablet Take 1 tablet (25 mg total) by mouth 3 (three) times daily as needed for dizziness. 30 tablet 0  . sertraline (ZOLOFT) 25 MG tablet sertraline 25 mg tablet  TAKE 1 TABLET (25 MG) BY MOUTH DAILY    . valsartan-hydrochlorothiazide (DIOVAN-HCT)  80-12.5 MG tablet valsartan 80 mg-hydrochlorothiazide 12.5 mg tablet  TAKE 1 TABLET BY MOUTH EVERY DAY     No current facility-administered medications for this visit.    REVIEW OF SYSTEMS:  [X]  denotes positive finding, [ ]  denotes negative finding Cardiac  Comments:  Chest pain or chest pressure:    Shortness of breath upon exertion: x   Short of breath when lying flat: x   Irregular heart rhythm:        Vascular    Pain in calf, thigh, or hip brought on by ambulation:    Pain in feet at night that wakes you up from your sleep:     Blood clot in your veins:    Leg swelling:         Pulmonary    Oxygen at home:    Productive cough:      Wheezing:         Neurologic    Sudden weakness in arms or legs:     Sudden numbness in arms or legs:  x   Sudden onset of difficulty speaking or slurred speech:    Temporary loss of vision in one eye:     Problems with dizziness:         Gastrointestinal    Blood in stool:     Vomited blood:         Genitourinary    Burning when urinating:     Blood in urine:        Psychiatric    Major depression:         Hematologic    Bleeding problems:    Problems with blood clotting too easily:        Skin    Rashes or ulcers:        Constitutional    Fever or chills:      PHYSICAL EXAM: Vitals:   05/18/20 1233 05/18/20 1235  BP: 124/86 124/84  Pulse: 70   Resp: 20   Temp: 98.2 F (36.8 C)   SpO2: 99%   Weight: 109 lb 9.6 oz (49.7 kg)   Height: 4\' 11"  (1.499 m)     GENERAL: The patient is a well-nourished female, in no acute distress. The vital signs are documented above. CARDIOVASCULAR: She does have a soft carotid bruit at the base of her neck and also at the subclavian area.  No bruit on the right.  She has 2+ radial pulses which are equal bilaterally PULMONARY: There is good air exchange  ABDOMEN: Soft and non-tender  MUSCULOSKELETAL: There are no major deformities or cyanosis. NEUROLOGIC: No focal weakness or paresthesias are detected. SKIN: There are no ulcers or rashes noted. PSYCHIATRIC: The patient has a normal affect.  DATA:  Noninvasive studies was reviewed.  This shows a completely normal carotid system bilaterally.  She has normal equal blood pressures in her right arm and left arm.  There is suggestion of turbulent flow in the left subclavian suggested possible proximal disease  MEDICAL ISSUES: Had a very long discussion with the patient.  She has no risk factors for atherosclerotic change.  She is healthy 49 year old who has not smoked.  This may be simply some angulation or kinking causing a bruit.  She is not having any hemodynamic effect from this.   She has a equal normal brachial pulse.  She has normal antegrade vertebral flow bilaterally.  I reassured the patient regarding this and would not recommend any further follow-up unless she would become symptomatic.  She was reassured with this discussion will see [place] again on an as-needed basis   Rosetta Posner, MD [facility] Tel [phone] Pager [phone]

## 2020-05-20 ENCOUNTER — Encounter: Payer: Self-pay | Admitting: Gastroenterology

## 2020-05-21 ENCOUNTER — Encounter: Payer: Self-pay | Admitting: Gastroenterology

## 2020-07-21 ENCOUNTER — Encounter: Payer: 59 | Admitting: Gastroenterology

## 2020-10-30 IMAGING — MG DIGITAL DIAGNOSTIC BILAT W/ TOMO W/ CAD
8 series · 9 of 24 positions shown · non-contrast
Comparison: 04/25/2018 and earlier

CLINICAL DATA: Patient is able to palpate a marble sized mass in
the LEFT breast, 1-2 o'clock location two weeks ago. Patient
palpated a mass for 4 days within the mass completely resolved. The
patient is asymptomatic today.

EXAM:
DIGITAL DIAGNOSTIC BILATERAL MAMMOGRAM WITH CAD AND TOMO

[L MLO synth-2D]
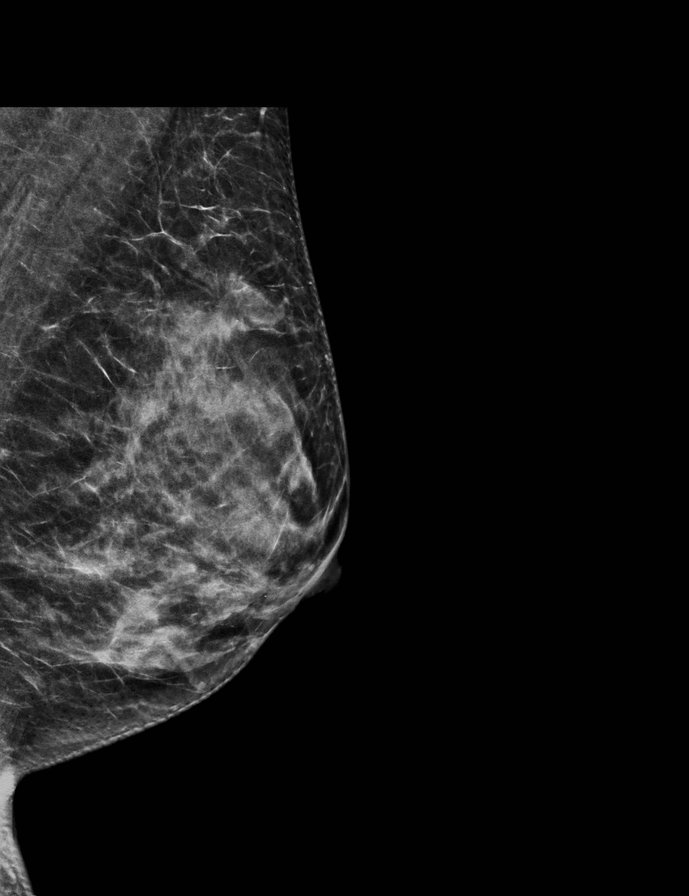

[R CC synth-2D]
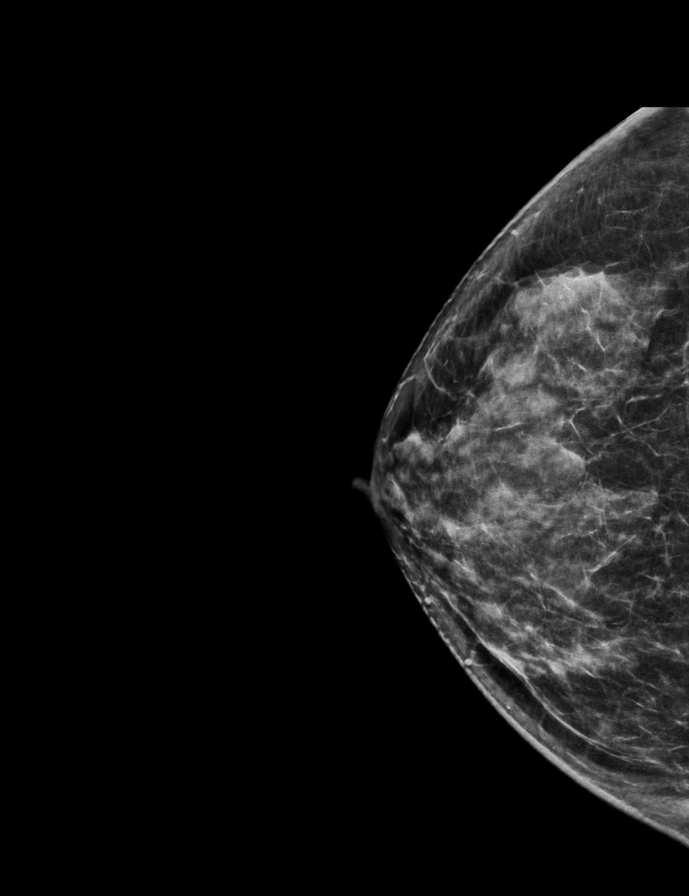

[L CC synth-2D]
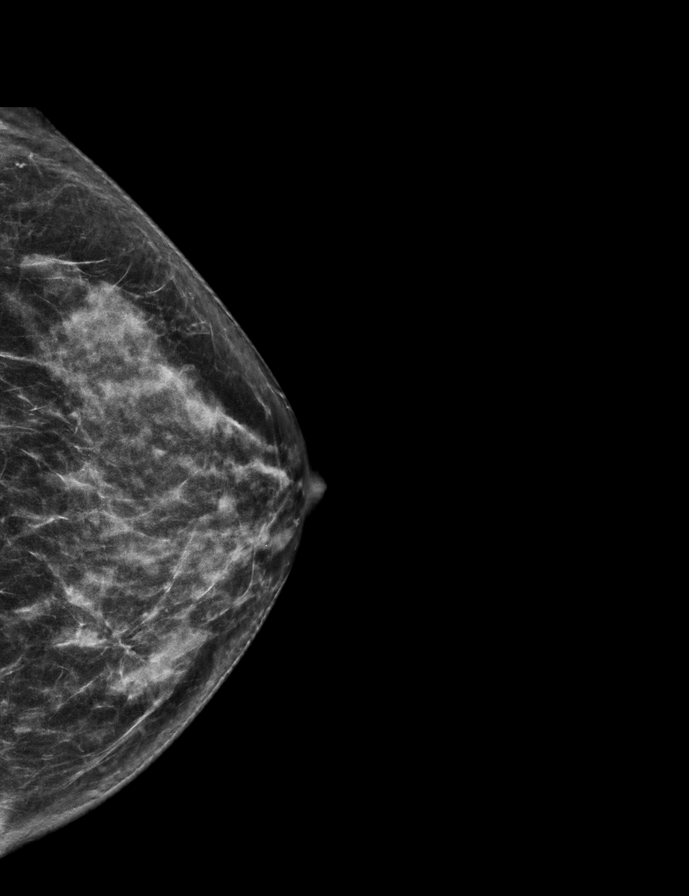

[R MLO synth-2D]
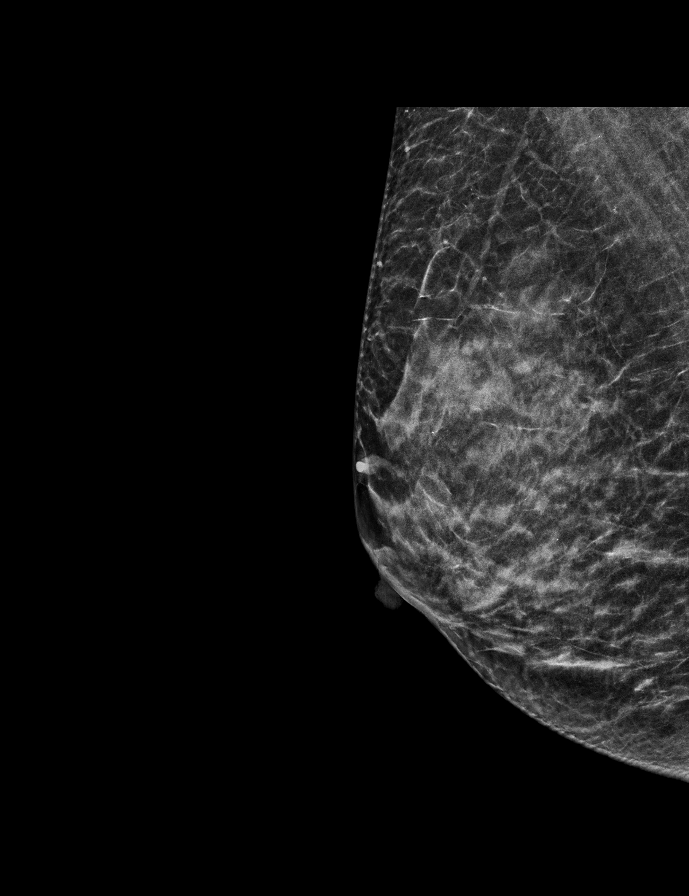

[R CC tomo · 2 of 61 frames shown]
[frame 20/61]
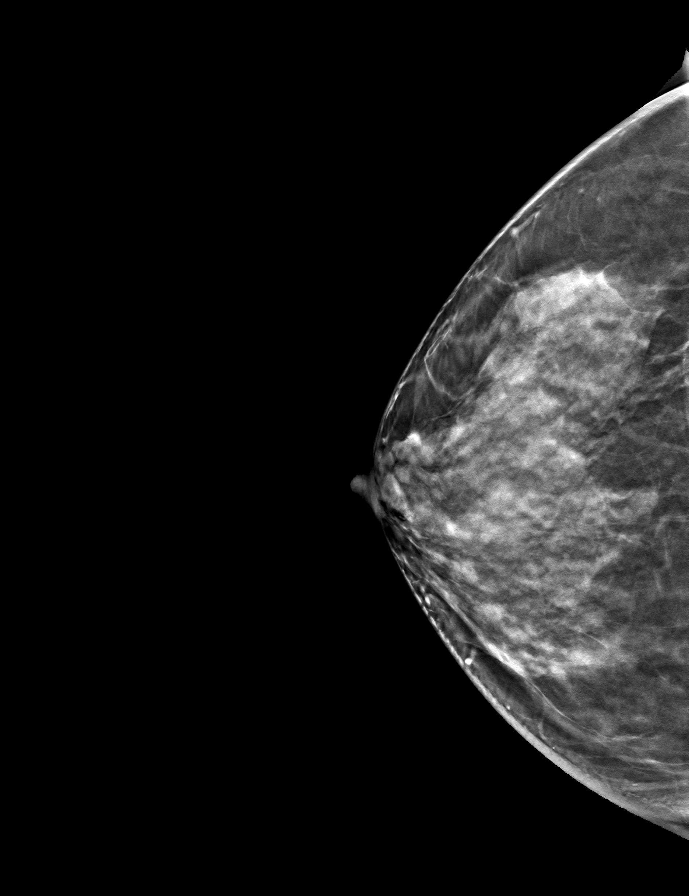
[frame 31/61]
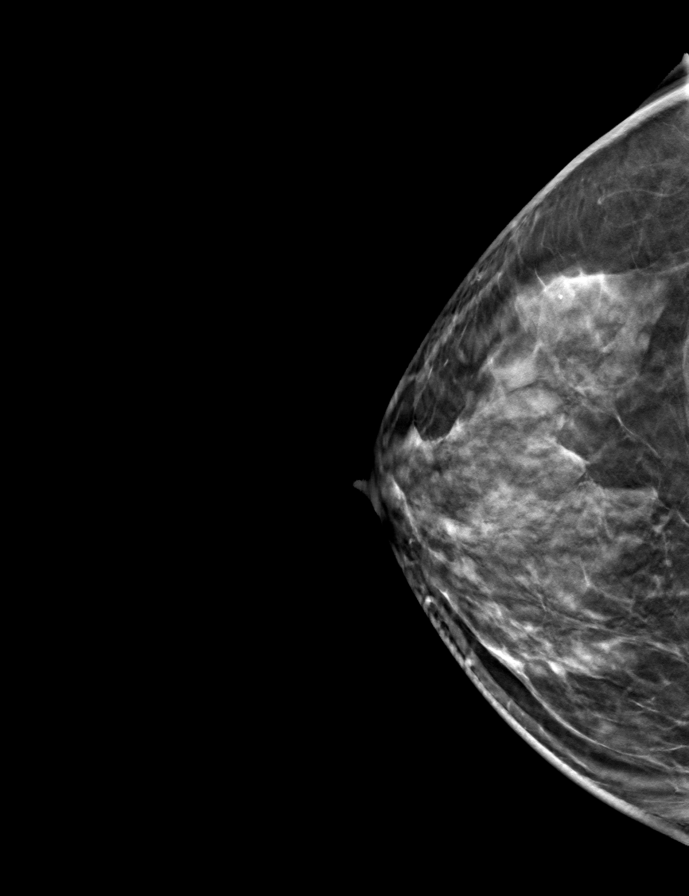

[R MLO tomo · tomo slice 25/50.0]
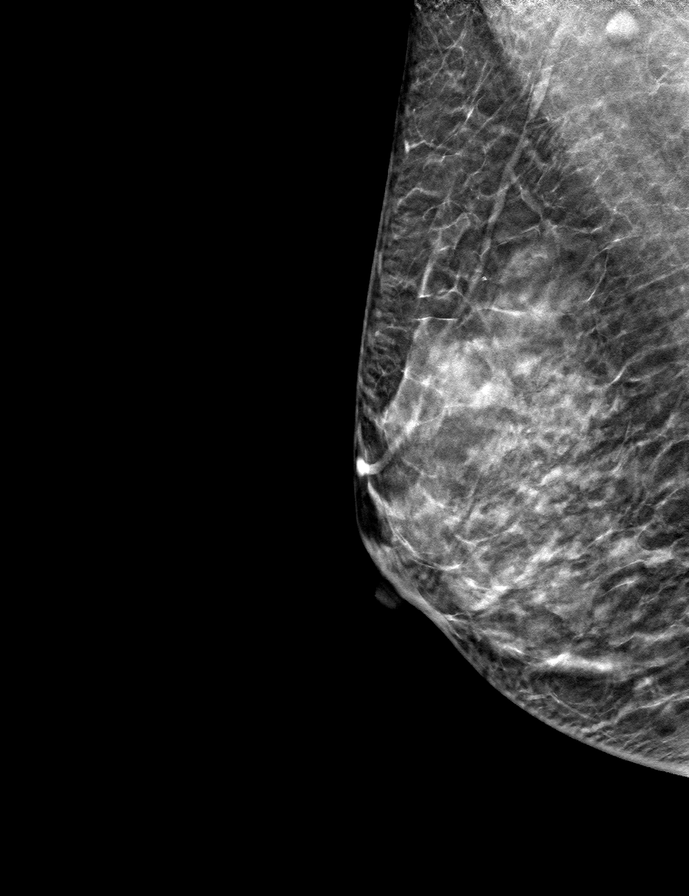

[L MLO tomo · tomo slice 28/55.0]
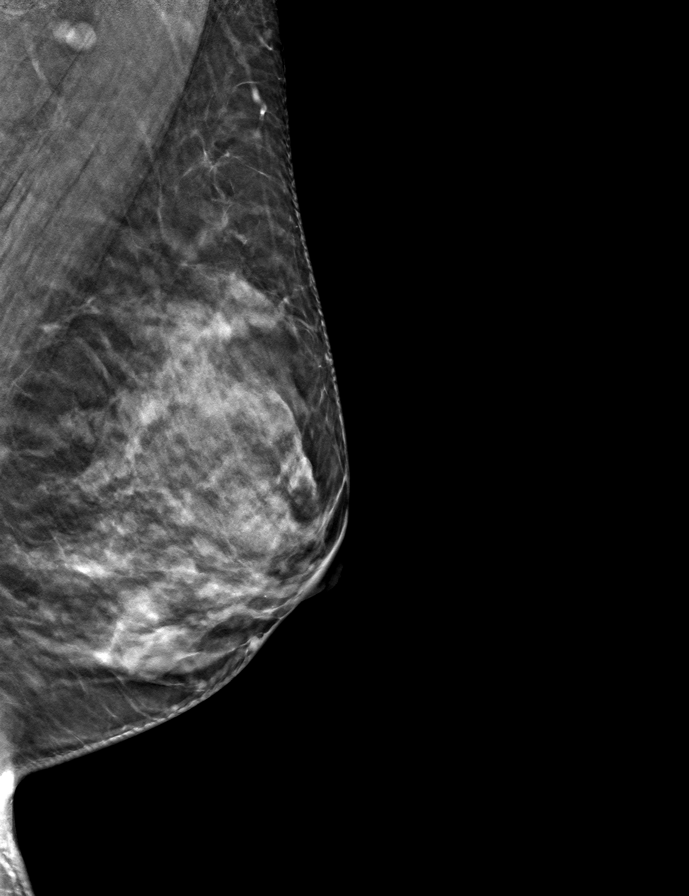

[L CC tomo · tomo slice 29/57.0]
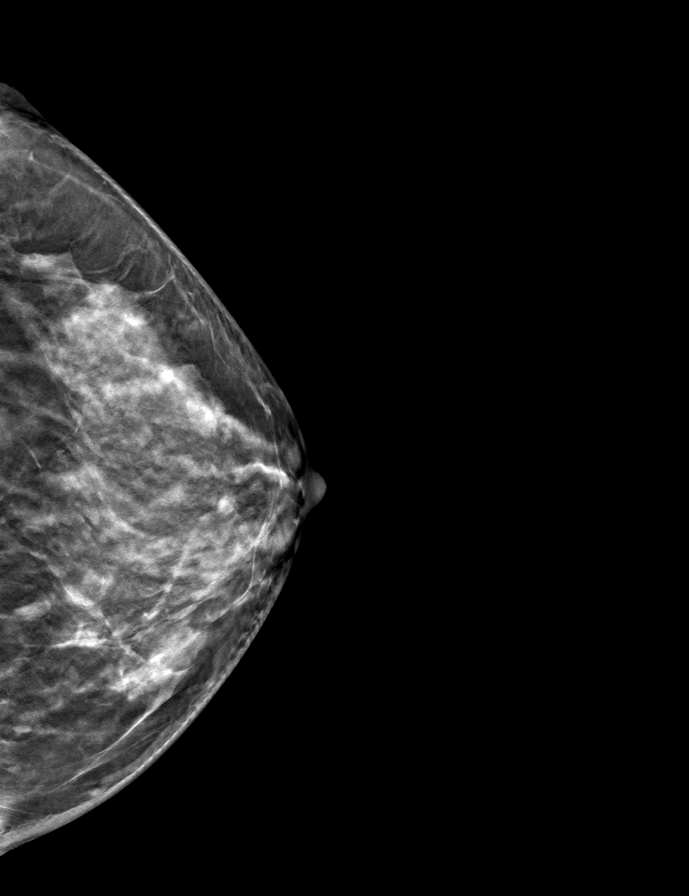

[9 of 24 positions shown; findings below may reference images not displayed]

ACR Breast Density Category c: The breast tissue is heterogeneously
dense, which may obscure small masses.
FINDINGS: No suspicious mass, distortion, or microcalcifications are
identified to suggest presence of malignancy. Specifically, no mass
identified in the UPPER-OUTER QUADRANT of the LEFT.

Mammographic images were processed with CAD.
IMPRESSION: No mammographic evidence for malignancy.

RECOMMENDATION:
Screening mammogram in one year.(Code:ZI-1-IUK)

I have discussed the findings and recommendations with the patient.
If applicable, a reminder letter will be sent to the patient
regarding the next appointment.

BI-RADS CATEGORY  1: Negative.
